# Patient Record
Sex: Male | Born: 1970 | Race: Black or African American | Hispanic: No | Marital: Married | Smoking: Never smoker
Health system: Southern US, Community
[De-identification: ages and names within clinical notes are randomized; demographics above are authoritative.]

## PROBLEM LIST (undated history)

## (undated) DIAGNOSIS — E669 Obesity, unspecified: Secondary | ICD-10-CM

## (undated) DIAGNOSIS — T7840XA Allergy, unspecified, initial encounter: Secondary | ICD-10-CM

## (undated) DIAGNOSIS — L731 Pseudofolliculitis barbae: Secondary | ICD-10-CM

## (undated) DIAGNOSIS — I10 Essential (primary) hypertension: Secondary | ICD-10-CM

## (undated) DIAGNOSIS — K429 Umbilical hernia without obstruction or gangrene: Secondary | ICD-10-CM

## (undated) DIAGNOSIS — F419 Anxiety disorder, unspecified: Secondary | ICD-10-CM

## (undated) DIAGNOSIS — K219 Gastro-esophageal reflux disease without esophagitis: Secondary | ICD-10-CM

## (undated) DIAGNOSIS — N401 Enlarged prostate with lower urinary tract symptoms: Secondary | ICD-10-CM

## (undated) DIAGNOSIS — B9681 Helicobacter pylori [H. pylori] as the cause of diseases classified elsewhere: Secondary | ICD-10-CM

## (undated) HISTORY — DX: Pseudofolliculitis barbae: L73.1

## (undated) HISTORY — DX: Essential (primary) hypertension: I10

## (undated) HISTORY — DX: Allergy, unspecified, initial encounter: T78.40XA

## (undated) HISTORY — PX: UMBILICAL HERNIA REPAIR: SHX196

## (undated) HISTORY — DX: Anxiety disorder, unspecified: F41.9

---

## 2005-12-29 ENCOUNTER — Other Ambulatory Visit: Payer: Self-pay

## 2005-12-29 ENCOUNTER — Emergency Department: Payer: Self-pay | Admitting: Emergency Medicine

## 2005-12-30 ENCOUNTER — Ambulatory Visit: Payer: Self-pay | Admitting: Emergency Medicine

## 2006-08-22 ENCOUNTER — Other Ambulatory Visit: Payer: Self-pay

## 2006-08-22 ENCOUNTER — Emergency Department: Payer: Self-pay | Admitting: General Practice

## 2006-11-11 ENCOUNTER — Emergency Department (HOSPITAL_COMMUNITY): Admission: EM | Admit: 2006-11-11 | Discharge: 2006-11-11 | Payer: Self-pay | Admitting: Emergency Medicine

## 2009-02-02 ENCOUNTER — Emergency Department: Payer: Self-pay | Admitting: Emergency Medicine

## 2009-10-29 ENCOUNTER — Emergency Department: Payer: Self-pay | Admitting: Internal Medicine

## 2010-09-02 IMAGING — CT CT HEAD WITHOUT CONTRAST
2 series · 16 of 30 positions shown, 20 images · non-contrast
Comparison: none

REASON FOR EXAM: syncoep
COMMENTS:

PROCEDURE:     CT  - CT HEAD WITHOUT CONTRAST  - October 29, 2009  [DATE]
RESULT:     Comparison:  None
TECHNIQUE: Multiple axial images from the foramen magnum to the vertex were
obtained without IV contrast.

[Series 2: without · axial · non-contrast · 0.40mm/px · z∈[-176,-50]mm · 13 of 31 slices shown, 17 images]
[im 3/31  brain]
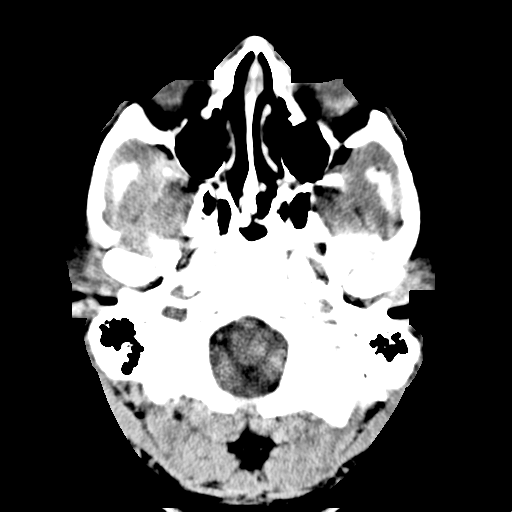
[im 3/31  bone]
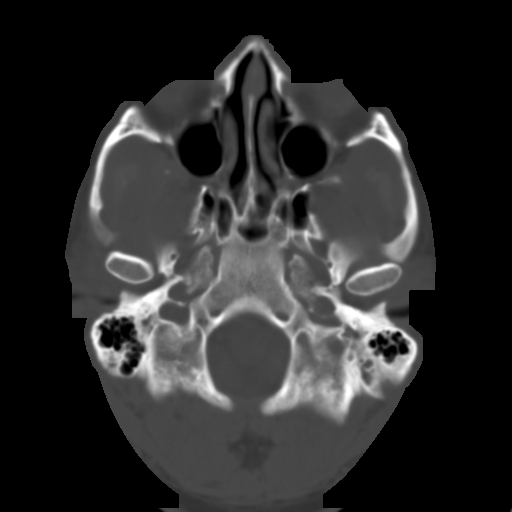
[im 5/31  brain]
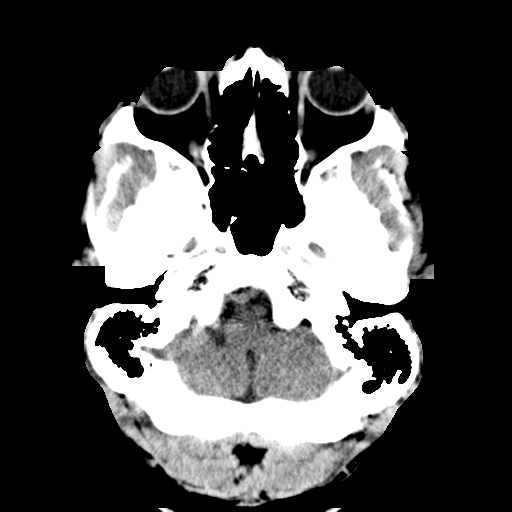
[im 7/31  brain]
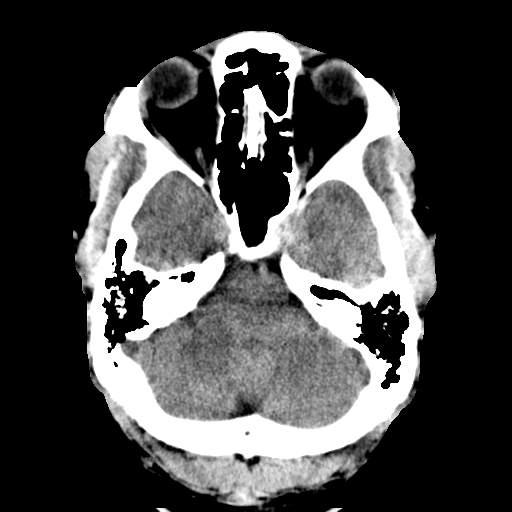
[im 9/31  brain]
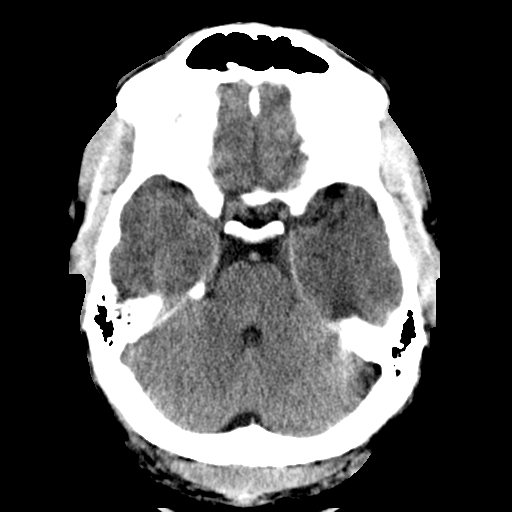
[im 11/31  brain]
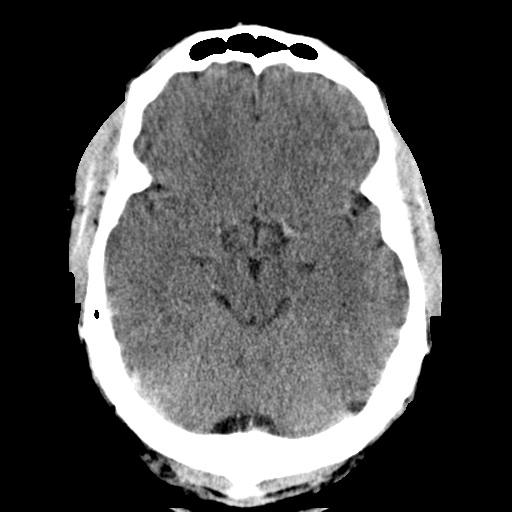
[im 11/31  bone]
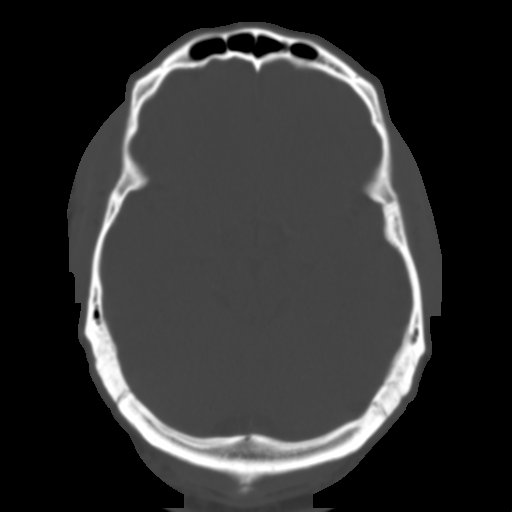
[im 13/31  brain]
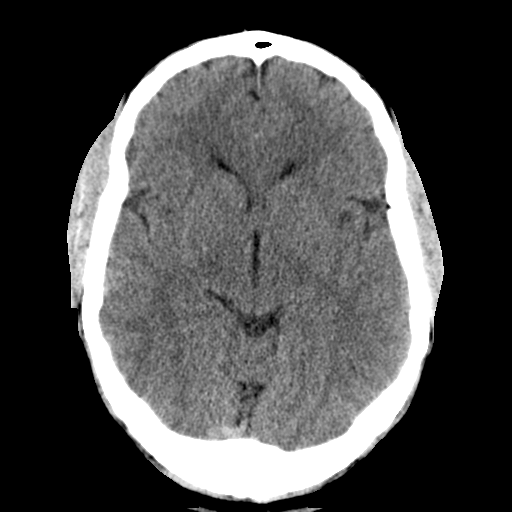
[im 16/31  brain]
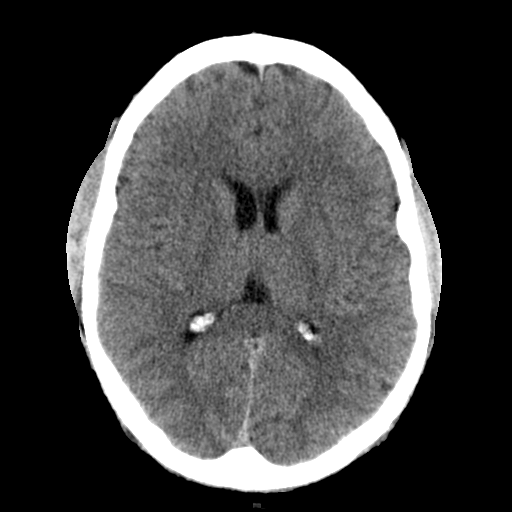
[im 18/31  brain]
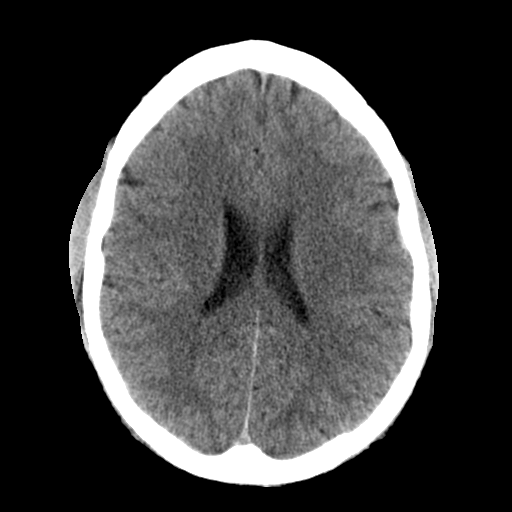
[im 20/31  brain]
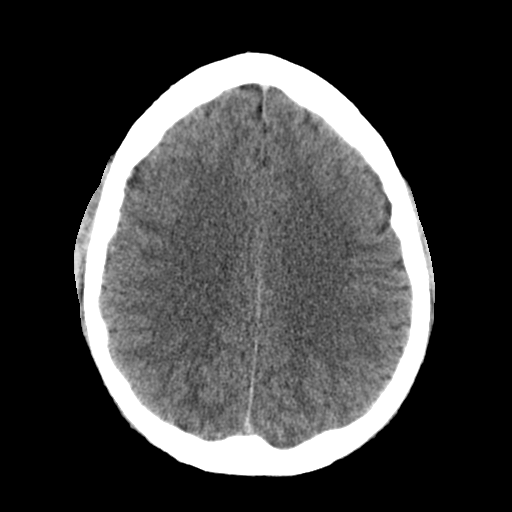
[im 20/31  bone]
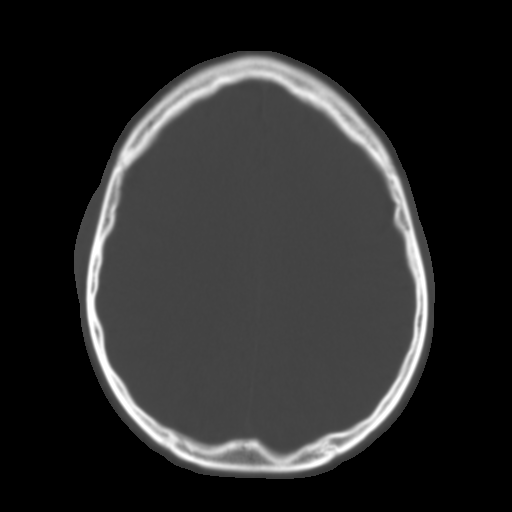
[im 22/31  brain]
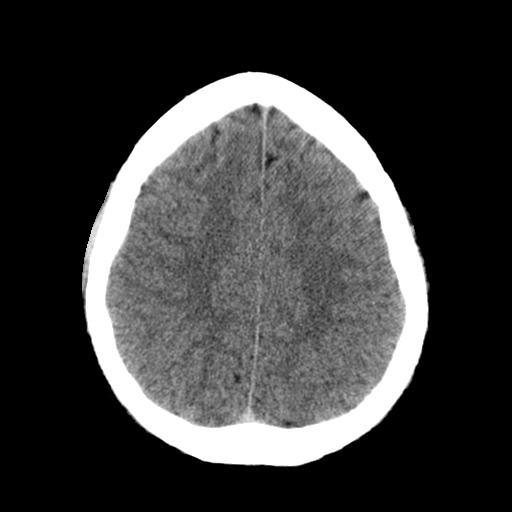
[im 24/31  brain]
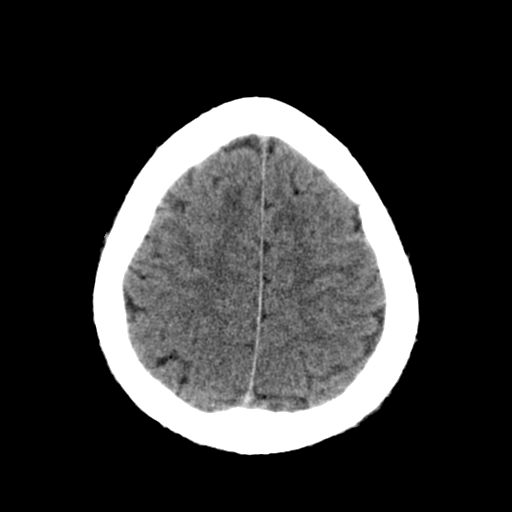
[im 26/31  brain]
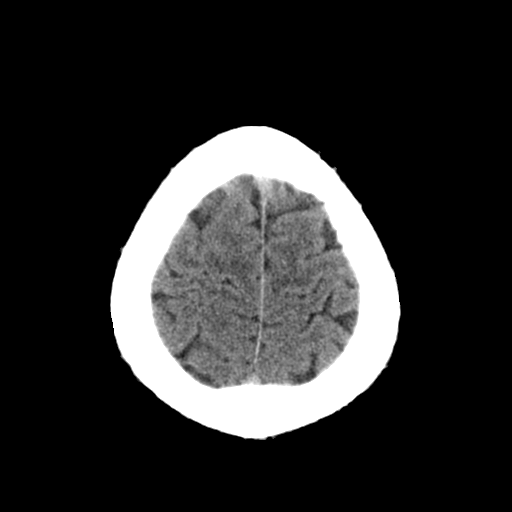
[im 28/31  brain]
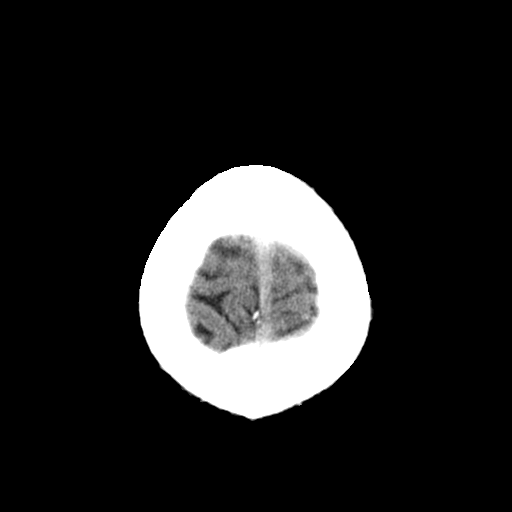
[im 28/31  bone]
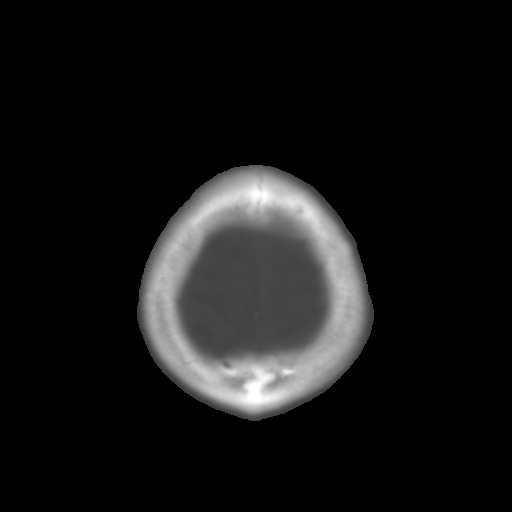

[Series 3: bone · axial · 0.40mm/px · z∈[-176,-136]mm · 3 of 31 slices shown]
[im 3/31  bone]
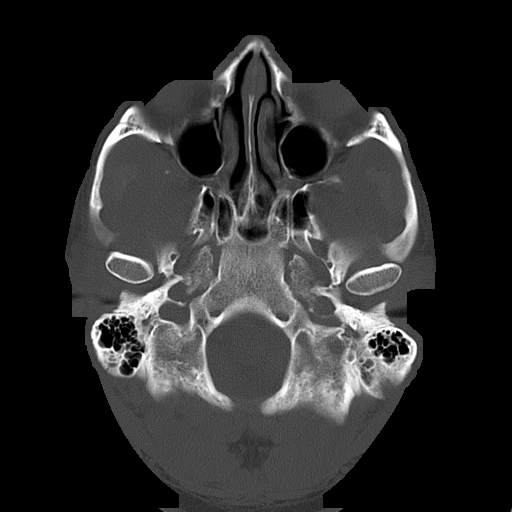
[im 7/31  bone]
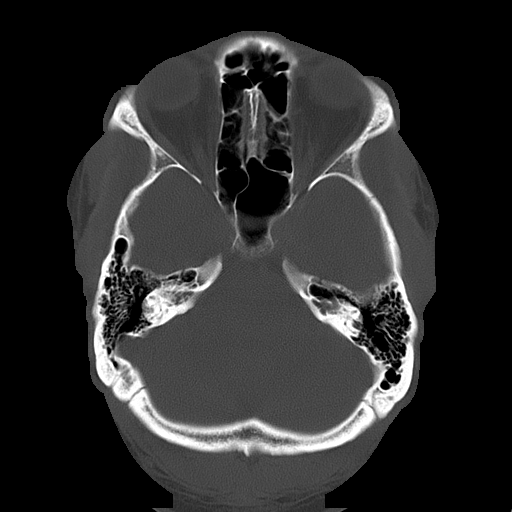
[im 11/31  bone]
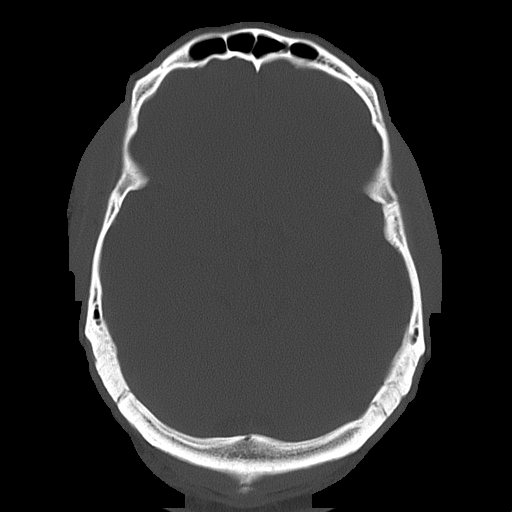

[16 of 30 positions shown; findings below may reference images not displayed]

FINDINGS: There is no evidence of mass effect, midline shift, or extra-axial fluid
collections.  There is no evidence of a space-occupying lesion or
intracranial hemorrhage. There is no evidence of a cortical-based area of
acute infarction.

The ventricles and sulci are appropriate for the patient's age. The basal
cisterns are patent.

Visualized portions of the orbits are unremarkable. The visualized portions
of the paranasal sinuses and mastoid air cells are unremarkable.

The osseous structures are unremarkable.
IMPRESSION: No acute intracranial process.

## 2010-09-02 IMAGING — US ABDOMEN ULTRASOUND
1 series · 17 of 25 positions shown · non-contrast
Comparison: none

REASON FOR EXAM: abd pain
COMMENTS:

[Series 1: abdomen ultrasound · 17 of 66 slices shown]
[im 1/66]
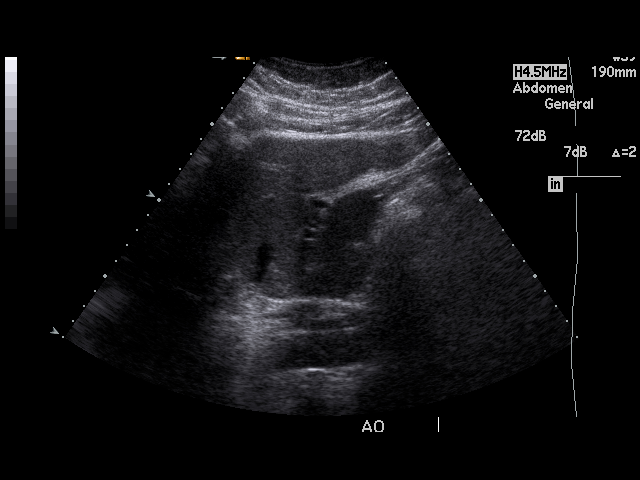
[im 6/66]
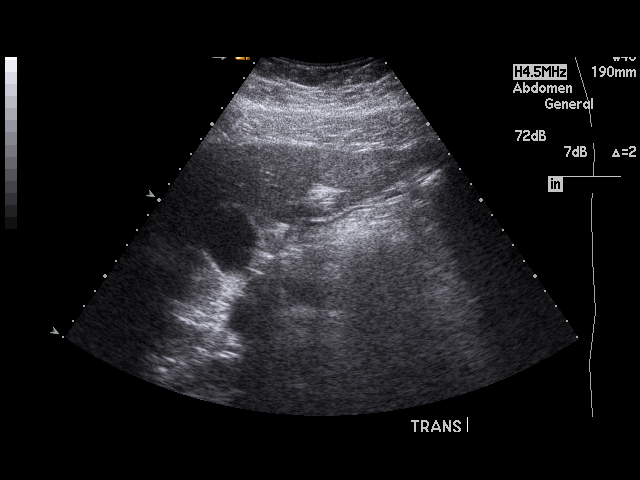
[im 9/66]
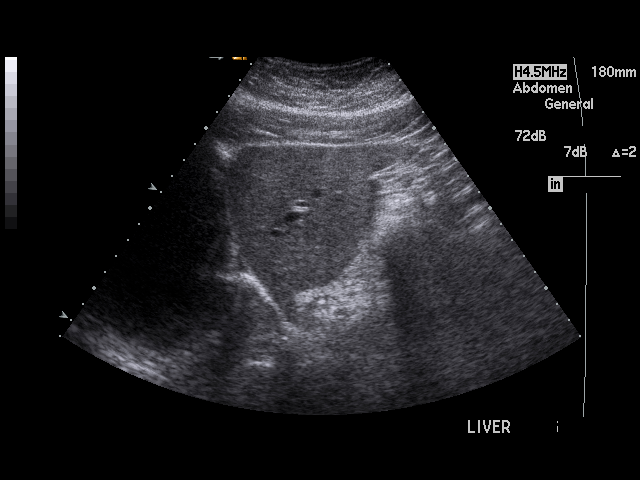
[im 14/66]
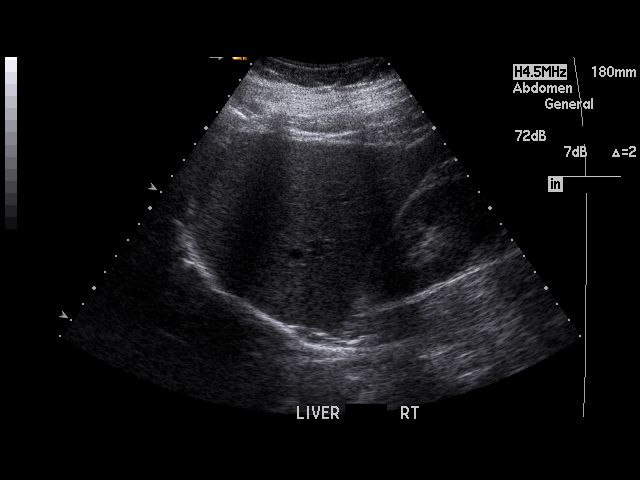
[im 17/66]
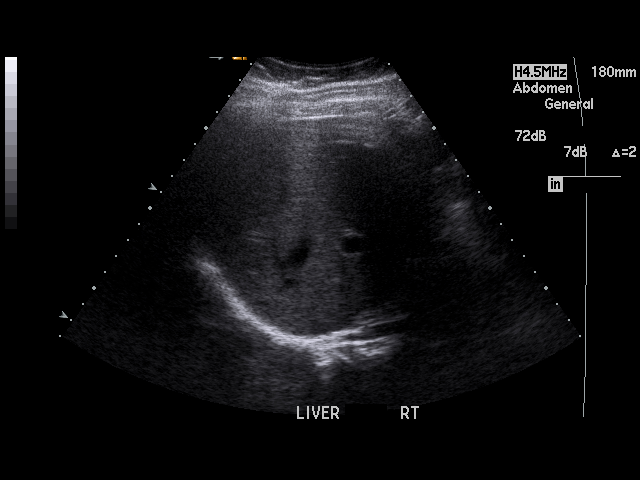
[im 22/66]
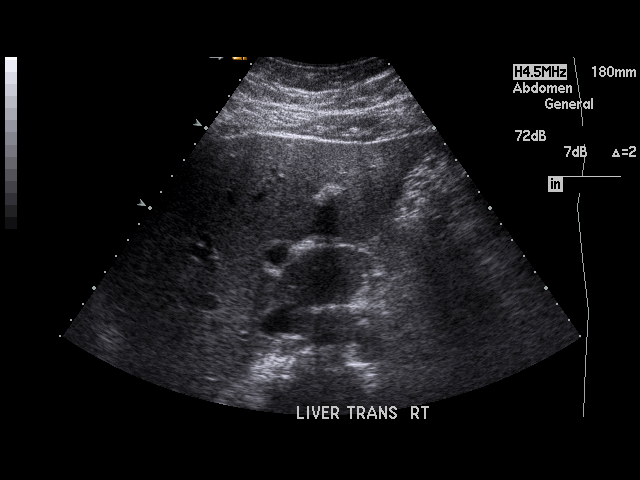
[im 25/66]
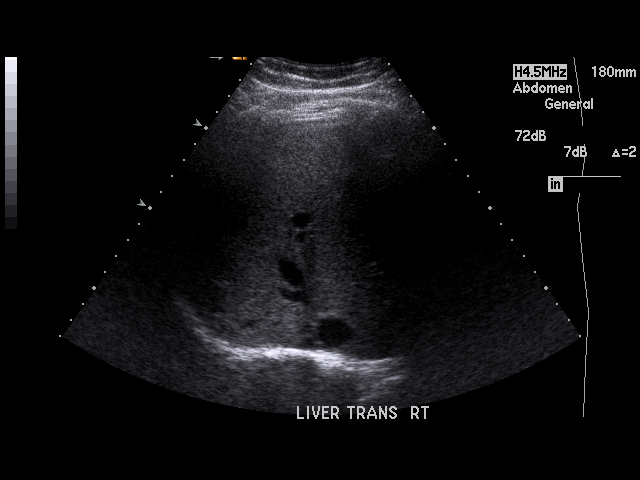
[im 30/66]
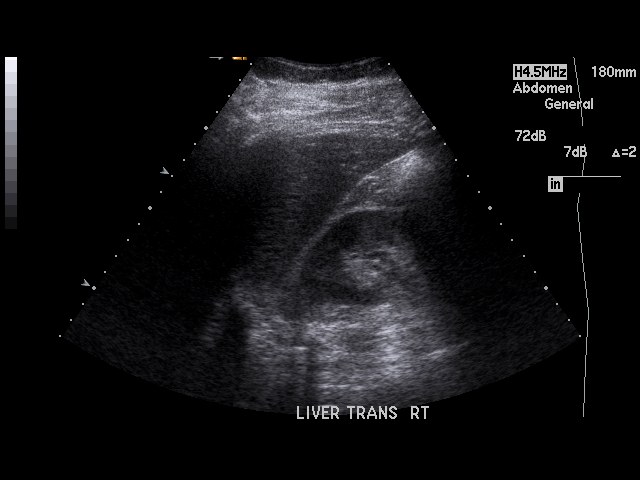
[im 33/66]
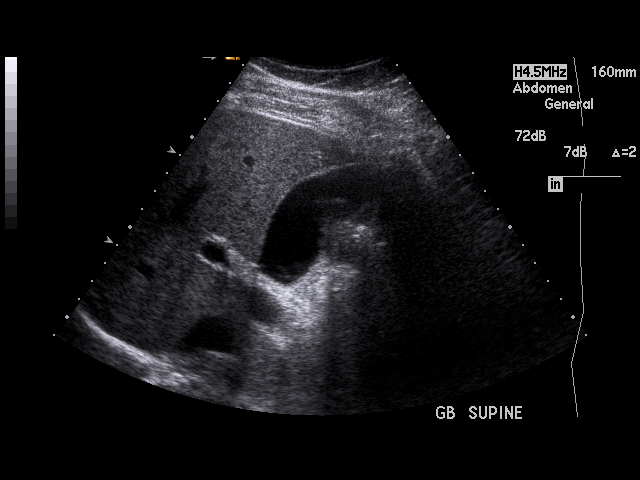
[im 36/66]
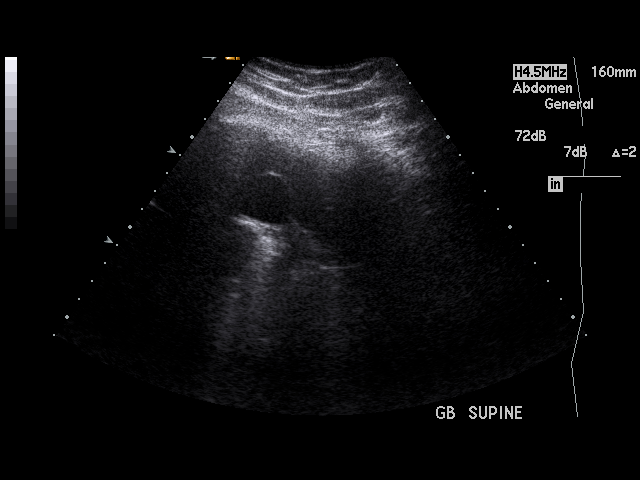
[im 41/66]
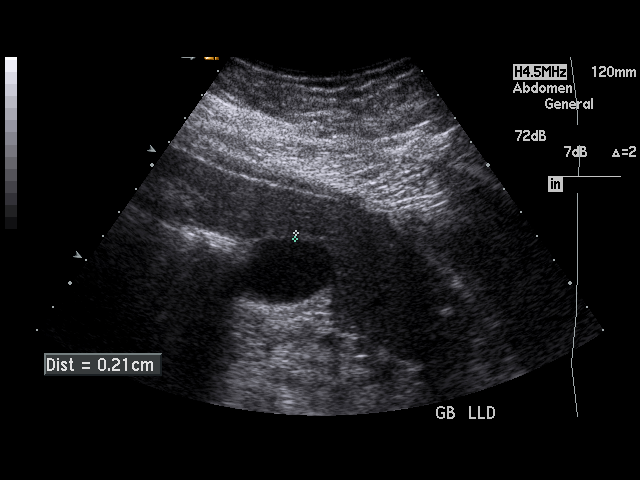
[im 44/66]
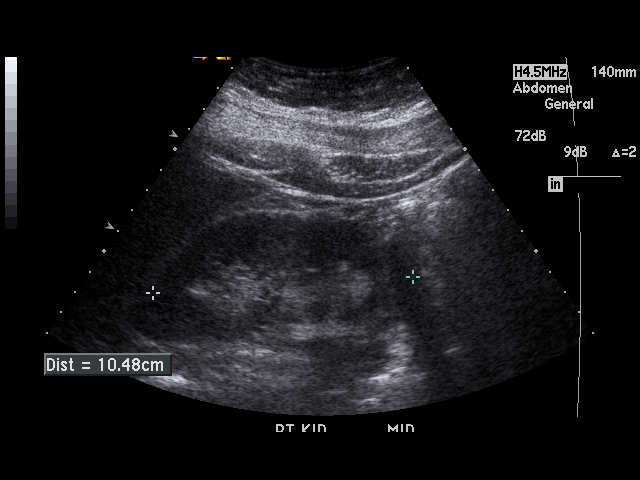
[im 49/66]
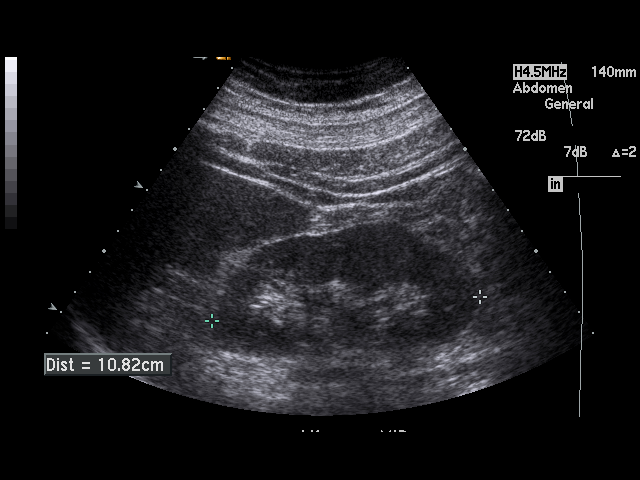
[im 52/66]
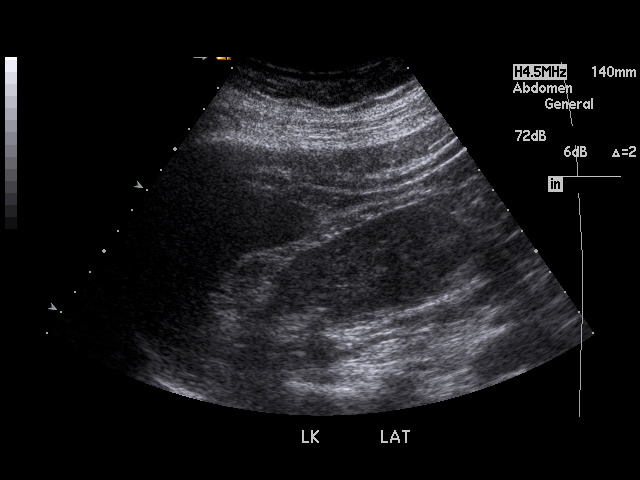
[im 57/66]
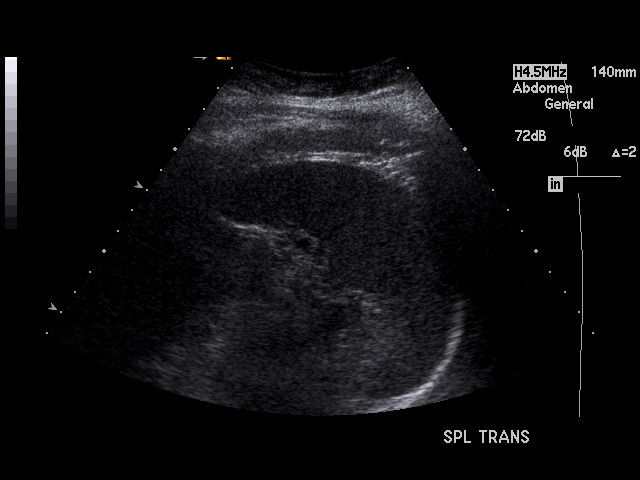
[im 60/66]
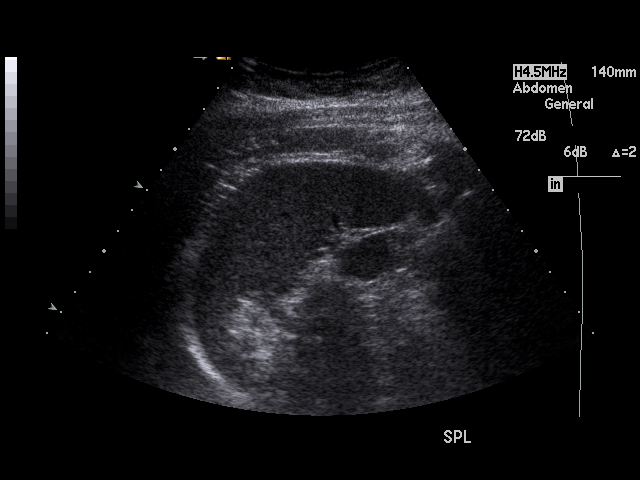
[im 66/66]
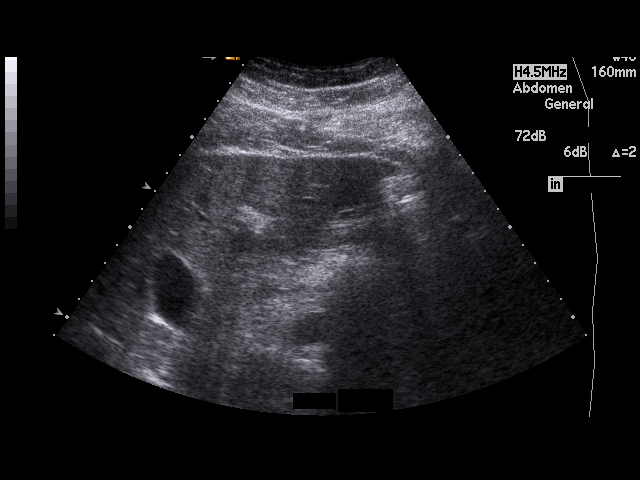

[17 of 25 positions shown; findings below may reference images not displayed]

PROCEDURE:     US  - US ABDOMEN GENERAL SURVEY  - October 29, 2009  [DATE]

RESULT:     The liver, spleen, abdominal aorta and inferior vena cava show
no significant abnormalities. The head and body of the pancreas are normal
in appearance. The pancreatic tail is obscured by bowel gas. No gallstones
are seen. There is no thickening of the gallbladder wall. The common bile
duct measures 2.0 mm in diameter which is within normal limits. The kidneys
show no hydronephrosis. There is no ascites.
IMPRESSION: No significant abnormalities are noted.

## 2011-01-08 ENCOUNTER — Emergency Department: Payer: Self-pay | Admitting: Internal Medicine

## 2012-07-16 ENCOUNTER — Emergency Department: Payer: Self-pay | Admitting: Unknown Physician Specialty

## 2012-07-17 LAB — CBC
HGB: 14.3 g/dL (ref 13.0–18.0)
MCV: 87 fL (ref 80–100)
Platelet: 182 10*3/uL (ref 150–440)
RBC: 4.8 10*6/uL (ref 4.40–5.90)
RDW: 13.2 % (ref 11.5–14.5)
WBC: 5.1 10*3/uL (ref 3.8–10.6)

## 2012-07-17 LAB — COMPREHENSIVE METABOLIC PANEL
Anion Gap: 7 (ref 7–16)
Calcium, Total: 8.6 mg/dL (ref 8.5–10.1)
Co2: 27 mmol/L (ref 21–32)
Osmolality: 282 (ref 275–301)
SGOT(AST): 33 U/L (ref 15–37)
SGPT (ALT): 30 U/L (ref 12–78)
Sodium: 142 mmol/L (ref 136–145)

## 2012-07-17 LAB — TROPONIN I: Troponin-I: <0.02 ng/mL

## 2012-07-17 LAB — TSH: Thyroid Stimulating Horm: 3.07 u[IU]/mL

## 2016-10-03 ENCOUNTER — Encounter: Payer: Self-pay | Admitting: Nurse Practitioner

## 2016-10-03 ENCOUNTER — Ambulatory Visit (INDEPENDENT_AMBULATORY_CARE_PROVIDER_SITE_OTHER): Payer: BLUE CROSS/BLUE SHIELD | Admitting: Nurse Practitioner

## 2016-10-03 VITALS — BP 141/95 | HR 73 | Temp 97.8°F | Resp 16 | Ht 73.0 in | Wt 264.0 lb

## 2016-10-03 DIAGNOSIS — Z7689 Persons encountering health services in other specified circumstances: Secondary | ICD-10-CM

## 2016-10-03 DIAGNOSIS — J3089 Other allergic rhinitis: Secondary | ICD-10-CM | POA: Diagnosis not present

## 2016-10-03 DIAGNOSIS — Z6834 Body mass index (BMI) 34.0-34.9, adult: Secondary | ICD-10-CM | POA: Diagnosis not present

## 2016-10-03 DIAGNOSIS — R030 Elevated blood-pressure reading, without diagnosis of hypertension: Secondary | ICD-10-CM | POA: Diagnosis not present

## 2016-10-03 NOTE — Progress Notes (Signed)
Subjective:    Patient ID: Dean Morton, male    DOB: 09/16/1970, 46 y.o.   MRN: [id number]  Dean Morton is a 46 y.o. male presenting on 10/03/2016 for Establish Care  HPI  Mr. Mcclenathan is establishing a new PCP because his former PCP died last year.    Elevated BP and BMI  He has concerns about 2-3 isolated high blood pressure readings outside of the clinic setting.  Also concerned about BMI.  Hard to motivate to stay away from the foods he knows aren't healthy.  For the last 2 months he walks and exercises daily and notices his BP coming down and his clothes fitting looser.  He is eating better and is cutting back on pork, sugar, and some salt.  The highest BP readings were around 160/100 two months ago.  His last outside clinic BP was 133/88. Today is 141/95.  His weight 2 months ago was 289 lbs.  Today he weighs 274 lbs for a weight loss to date of 15 lbs.  Allergies  He reports a yearly URI - This year he was sick 3 weeks ago with URI.  He took amoxicillin without relief.  Then, he took levofloxacin and had diarrhea.  He ate some yogurt and bowels normal now with formed stool.  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  Workplace health and safety added a respirator and improved ventilation.  Since these improvements his eye allergies are better, but questions if he needs a different respirator.  Right knee pain Old sports injury in high school. Since the injury there is a bony protrusion to the posterior medial aspect of the knee.  Last week it flared with pain and swelling and he is a little concerned that it might flare in future.    Past Medical History:  Diagnosis Date  . Allergy    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,000 +)  . Anxiety   . Hypertension    took BP meds with panic attacks  . Pseudofolliculitis barbae    Past Surgical History:  Procedure Laterality Date  . UMBILICAL HERNIA REPAIR      Social History   Social History  . Marital status: Married    Spouse name: N/A  . Number of children: N/A  . Years of education: N/A   Occupational History  . Not on file.   Social History Main Topics  . Smoking status: Never Smoker  . Smokeless tobacco: Never Used  . Alcohol use No  . Drug use: No  . Sexual activity: Yes    Birth control/ protection: None   Other Topics Concern  . Not on file   Social History Narrative  . No narrative on file   Family History  Problem Relation Age of Onset  . Hypertension Mother   . Stroke Maternal Grandmother    No current outpatient prescriptions on file prior to visit.   No current facility-administered medications on file prior to visit.    Since losing his PCP, he has been to some free employer screenings for BP, cholesterol, but notes he missed the prostate screen.  He had a colonoscopy at [facility] in 2007 to eval anxiety/GERD.  "Clipped noncancerous node in stomach" but there were no polyps or precancerous lesions then.  [facility].  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  Contaminants include white phosphorous.  Washing and bathing in contaminated water - Got home from training and had a letter from [place] and can't find it that requested testing after he ended active duty.   Review of Systems Per HPI unless specifically indicated above     Objective:    BP (!) 141/95 (BP Location: Right Arm, Patient Position: Sitting, Cuff Size: Large)   Pulse 73   Temp 97.8 F (36.6 C) (Oral)   Resp 16   Ht  (1.854 m)   Wt 264 lb (119.7 kg)   BMI 34.83 kg/m   Wt Readings from Last 3 Encounters:  10/03/16 264 lb (119.7 kg)    Physical Exam  Constitutional: He is oriented to person, place, and time. He appears well-developed and well-nourished. No distress.  HENT:  Head: Normocephalic and atraumatic.  Right Ear:  External ear normal.  Left Ear: External ear normal.  Nose: Nose normal.  Mouth/Throat: Oropharynx is clear and moist.  Eyes: EOM are normal. Pupils are equal, round, and reactive to light. Right conjunctiva is injected. Left conjunctiva is injected.  Lacrimation  Neck: Normal range of motion. Neck supple. No JVD present. Carotid bruit is not present. No tracheal deviation present. No thyromegaly present.  Cardiovascular: Normal rate, regular rhythm, normal heart sounds and intact distal pulses.   Pulmonary/Chest: Effort normal and breath sounds normal.  Abdominal: Soft. Bowel sounds are normal. He exhibits no distension and no mass. There is no tenderness.  Musculoskeletal: Normal range of motion.  Lymphadenopathy:    He has no cervical adenopathy.  Neurological: He is alert and oriented to person, place, and time. He has normal reflexes.  Skin: Skin is warm and dry.  Raised hair follicles/scaring in beard pattern on face  Psychiatric: He has a normal mood and affect. His behavior is normal. Judgment and thought content normal.  Vitals reviewed.     Assessment & Plan:   Problem List Items Addressed This Visit    Right knee pain, intermittent 1. Discussed to avoid ibuprofen r/t blood pressure.  Use acetaminophen if needing pain relief. 2. Use ice or heat application for inflammation 3. OTC muscle rub with lidocaine is a topical analgesic ok to use.  Avoid use with heat and ice. It is ok to use after ice and heat. 4. Consider orthopedic referral if persistent problems.  Environmental and seasonal allergies 1. Continue working with workplace health and safety for environmental exposures.    BMI 34.0-34.9,adult 1. Continue exercise plan and focus on diet. 2. Ambulatory nutrition counseling discussed and patient requested to help with his efforts toward weight loss and BP lowering.   Relevant Orders   Ambulatory referral to diabetic education   Elevated blood pressure reading 1  Continue exercise regimen. 2. Continue working on diet changes.  Discussed DASH diet. 3. Referral for nutrition counseling for additional advice. 4. Keep a BP log with BP reading every 1-2 weeks for next 3 months.  Goal is less than 130/90.  DIscussed emergency situations for seeking care in ED or via ambulance. 5. Reviewed signs/sx of hypertensive crisis and stroke.    Relevant Orders   Ambulatory referral to diabetic education    Other Visit Diagnoses    Establishing care with new doctor, encounter for    -  Primary      Meds ordered this encounter  Medications  . ePHEDrine-GuaiFENesin (BRONKAID PO)    Sig: Take 1 tablet by mouth daily as needed.   Marland Kitchen dextromethorphan-guaiFENesin (MUCINEX DM) 30-600 MG 12hr tablet  Sig: Take 1 tablet by mouth 2 (two) times daily as needed for cough.  . DISCONTD: LEVOFLOXACIN PO    Sig: Take 700 mg by mouth daily.     Follow up plan: Return in about 3 months (around 01/03/2017) for blood pressure management.  Wilhelmina Mcardle, DNP, AGNP-BC Adult Gerontology Nurse Practitioner [facility] 10/04/2016, 12:27 PM

## 2016-10-03 NOTE — Patient Instructions (Signed)
Dean Morton, Thank you for coming in to clinic today.   1. For your blood pressure: - Keep exercising. - Keep working on your diet.  - Nutrition department will call you to schedule.   -  Keep a blood pressure log.  Check about every 1-2 weeks for the next 3 months.  For now, your goal is less than 140/90.  Our long-term goal is less than 130/90.  2. For any knee pain you have - Avoid ibuprofen, aspirin and all other NSAIDs because they can raise your BP. - Apply ice and heat if it has flared.  Put ice or heat on the knee for 15 mins at a time, then take it off.  Up to 5-6 times per day. - You can also apply a muscle rub with lidocaine for pain if needed.  Do not use this before ice or heat because it can burn your skin. You can use it after.   - If you continue to have problems we can consider an orthopedic referral.   Please schedule a follow-up appointment with Wilhelmina Mcardle, AGNP in 3 months.  If you have any other questions or concerns, please feel free to call the clinic or send a message through MyChart. You may also schedule an earlier appointment if necessary.  Wilhelmina Mcardle, DNP, AGNP-BC Adult Gerontology Nurse Practitioner [facility]    DASH Eating Plan DASH stands for "Dietary Approaches to Stop Hypertension." The DASH eating plan is a healthy eating plan that has been shown to reduce high blood pressure (hypertension). It may also reduce your risk for type 2 diabetes, heart disease, and stroke. The DASH eating plan may also help with weight loss. What are tips for following this plan? General guidelines   Avoid eating more than 2,300 mg (milligrams) of salt (sodium) a day.   Limit alcohol intake to no more than 1 drink a day for nonpregnant women and 2 drinks a day for men. One drink equals 12 oz of beer, 5 oz of wine, or 1 oz of hard liquor.  Work with your health care provider to maintain a healthy body weight or to lose weight. Ask what an  ideal weight is for you.  Get at least 30 minutes of exercise that causes your heart to beat faster (aerobic exercise) most days of the week. Activities may include walking, swimming, or biking.  Work with your health care provider or diet and nutrition specialist (dietitian) to adjust your eating plan to your individual calorie needs. Reading food labels   Check food labels for the amount of sodium per serving. Choose foods with less than 5 percent of the Daily Value of sodium. Generally, foods with less than 300 mg of sodium per serving fit into this eating plan.  To find whole grains, look for the word "whole" as the first word in the ingredient list. Shopping   Buy products labeled as "low-sodium" or "no salt added."  Buy fresh foods. Avoid canned foods and premade or frozen meals. Cooking   Avoid adding salt when cooking. Use salt-free seasonings or herbs instead of table salt or sea salt. Check with your health care provider or pharmacist before using salt substitutes.  Do not fry foods. Cook foods using healthy methods such as baking, boiling, grilling, and broiling instead.  Cook with heart-healthy oils, such as olive, canola, soybean, or sunflower oil. Meal planning    Eat a balanced diet that includes:  5 or more servings of  fruits and vegetables each day. At each meal, try to fill half of your plate with fruits and vegetables.  Up to 6-8 servings of whole grains each day.  Less than 6 oz of lean meat, poultry, or fish each day. A 3-oz serving of meat is about the same size as a deck of cards. One egg equals 1 oz.  2 servings of low-fat dairy each day.  A serving of nuts, seeds, or beans 5 times each week.  Heart-healthy fats. Healthy fats called Omega-3 fatty acids are found in foods such as flaxseeds and coldwater fish, like sardines, salmon, and mackerel.  Limit how much you eat of the following:  Canned or prepackaged foods.  Food that is high in trans fat,  such as fried foods.  Food that is high in saturated fat, such as fatty meat.  Sweets, desserts, sugary drinks, and other foods with added sugar.  Full-fat dairy products.  Do not salt foods before eating.  Try to eat at least 2 vegetarian meals each week.  Eat more home-cooked food and less restaurant, buffet, and fast food.  When eating at a restaurant, ask that your food be prepared with less salt or no salt, if possible. What foods are recommended? The items listed may not be a complete list. Talk with your dietitian about what dietary choices are best for you. Grains  Whole-grain or whole-wheat bread. Whole-grain or whole-wheat pasta. Brown rice. Orpah Cobb. Bulgur. Whole-grain and low-sodium cereals. Pita bread. Low-fat, low-sodium crackers. Whole-wheat flour tortillas. Vegetables  Fresh or frozen vegetables (raw, steamed, roasted, or grilled). Low-sodium or reduced-sodium tomato and vegetable juice. Low-sodium or reduced-sodium tomato sauce and tomato paste. Low-sodium or reduced-sodium canned vegetables. Fruits  All fresh, dried, or frozen fruit. Canned fruit in natural juice (without added sugar). Meat and other protein foods  Skinless chicken or [place]. Ground chicken or [place]. Pork with fat trimmed off. Fish and seafood. Egg whites. Dried beans, peas, or lentils. Unsalted nuts, nut butters, and seeds. Unsalted canned beans. Lean cuts of beef with fat trimmed off. Low-sodium, lean deli meat. Dairy  Low-fat (1%) or fat-free (skim) milk. Fat-free, low-fat, or reduced-fat cheeses. Nonfat, low-sodium ricotta or cottage cheese. Low-fat or nonfat yogurt. Low-fat, low-sodium cheese. Fats and oils  Soft margarine without trans fats. Vegetable oil. Low-fat, reduced-fat, or light mayonnaise and salad dressings (reduced-sodium). Canola, safflower, olive, soybean, and sunflower oils. Avocado. Seasoning and other foods  Herbs. Spices. Seasoning mixes without salt. Unsalted popcorn  and pretzels. Fat-free sweets. What foods are not recommended? The items listed may not be a complete list. Talk with your dietitian about what dietary choices are best for you. Grains  Baked goods made with fat, such as croissants, muffins, or some breads. Dry pasta or rice meal packs. Vegetables  Creamed or fried vegetables. Vegetables in a cheese sauce. Regular canned vegetables (not low-sodium or reduced-sodium). Regular canned tomato sauce and paste (not low-sodium or reduced-sodium). Regular tomato and vegetable juice (not low-sodium or reduced-sodium). Rosita Fire. Olives. Fruits  Canned fruit in a light or heavy syrup. Fried fruit. Fruit in cream or butter sauce. Meat and other protein foods  Fatty cuts of meat. Ribs. Fried meat. Tomasa Blase. Sausage. Bologna and other processed lunch meats. Salami. Fatback. Hotdogs. Bratwurst. Salted nuts and seeds. Canned beans with added salt. Canned or smoked fish. Whole eggs or egg yolks. Chicken or [place] with skin. Dairy  Whole or 2% milk, cream, and half-and-half. Whole or full-fat cream cheese. Whole-fat or sweetened yogurt.  Full-fat cheese. Nondairy creamers. Whipped toppings. Processed cheese and cheese spreads. Fats and oils  Butter. Stick margarine. Lard. Shortening. Ghee. Bacon fat. Tropical oils, such as coconut, palm kernel, or palm oil. Seasoning and other foods  Salted popcorn and pretzels. Onion salt, garlic salt, seasoned salt, table salt, and sea salt. Worcestershire sauce. Tartar sauce. Barbecue sauce. Teriyaki sauce. Soy sauce, including reduced-sodium. Steak sauce. Canned and packaged gravies. Fish sauce. Oyster sauce. Cocktail sauce. Horseradish that you find on the shelf. Ketchup. Mustard. Meat flavorings and tenderizers. Bouillon cubes. Hot sauce and Tabasco sauce. Premade or packaged marinades. Premade or packaged taco seasonings. Relishes. Regular salad dressings. Where to find more information:  National Heart, Lung, and Blood  Institute: PopSteam.is  American Heart Association: www.heart.org Summary  The DASH eating plan is a healthy eating plan that has been shown to reduce high blood pressure (hypertension). It may also reduce your risk for type 2 diabetes, heart disease, and stroke.  With the DASH eating plan, you should limit salt (sodium) intake to 2,300 mg a day. If you have hypertension, you may need to reduce your sodium intake to 1,500 mg a day.  When on the DASH eating plan, aim to eat more fresh fruits and vegetables, whole grains, lean proteins, low-fat dairy, and heart-healthy fats.  Work with your health care provider or diet and nutrition specialist (dietitian) to adjust your eating plan to your individual calorie needs. This information is not intended to replace advice given to you by your health care provider. Make sure you discuss any questions you have with your health care provider. Document Released: 06/12/2011 Document Revised: 06/16/2016 Document Reviewed: 06/16/2016 Elsevier Interactive Patient Education  2017 [employer].

## 2016-10-04 ENCOUNTER — Encounter: Payer: Self-pay | Admitting: Nurse Practitioner

## 2016-10-04 DIAGNOSIS — Z6834 Body mass index (BMI) 34.0-34.9, adult: Secondary | ICD-10-CM | POA: Insufficient documentation

## 2016-10-04 DIAGNOSIS — R030 Elevated blood-pressure reading, without diagnosis of hypertension: Secondary | ICD-10-CM | POA: Insufficient documentation

## 2016-10-04 DIAGNOSIS — J3089 Other allergic rhinitis: Secondary | ICD-10-CM | POA: Insufficient documentation

## 2016-10-04 DIAGNOSIS — E669 Obesity, unspecified: Secondary | ICD-10-CM | POA: Insufficient documentation

## 2016-10-06 NOTE — Progress Notes (Signed)
I have reviewed this encounter including the documentation in this note and/or discussed this patient with the provider, Wilhelmina Mcardle, AGPCNP-BC. I am certifying that I agree with the content of this note as supervising physician.  Saralyn Pilar, DO [facility] 10/06/2016, 12:20 PM

## 2017-01-16 ENCOUNTER — Ambulatory Visit: Payer: BLUE CROSS/BLUE SHIELD | Admitting: Nurse Practitioner

## 2017-05-28 ENCOUNTER — Emergency Department: Payer: BLUE CROSS/BLUE SHIELD

## 2017-05-28 ENCOUNTER — Other Ambulatory Visit: Payer: Self-pay

## 2017-05-28 ENCOUNTER — Emergency Department
Admission: EM | Admit: 2017-05-28 | Discharge: 2017-05-28 | Disposition: A | Discharge status: Home / Self Care | Payer: BLUE CROSS/BLUE SHIELD | Attending: Emergency Medicine | Admitting: Emergency Medicine

## 2017-05-28 ENCOUNTER — Encounter: Payer: Self-pay | Admitting: Emergency Medicine

## 2017-05-28 DIAGNOSIS — K2100 Gastro-esophageal reflux disease with esophagitis, without bleeding: Secondary | ICD-10-CM

## 2017-05-28 DIAGNOSIS — K229 Disease of esophagus, unspecified: Secondary | ICD-10-CM | POA: Insufficient documentation

## 2017-05-28 DIAGNOSIS — I10 Essential (primary) hypertension: Secondary | ICD-10-CM | POA: Insufficient documentation

## 2017-05-28 DIAGNOSIS — R1013 Epigastric pain: Secondary | ICD-10-CM | POA: Diagnosis present

## 2017-05-28 LAB — BASIC METABOLIC PANEL
Anion gap: 8 (ref 5–15)
BUN: 16 mg/dL (ref 6–20)
CHLORIDE: 103 mmol/L (ref 101–111)
CO2: 28 mmol/L (ref 22–32)
Calcium: 9.6 mg/dL (ref 8.9–10.3)
Creatinine, Ser: 1.22 mg/dL (ref 0.61–1.24)
GFR calc Af Amer: >60 mL/min (ref >60)
Glucose, Bld: 84 mg/dL (ref 65–99)
POTASSIUM: 4 mmol/L (ref 3.5–5.1)
SODIUM: 139 mmol/L (ref 135–145)

## 2017-05-28 LAB — CBC
HEMATOCRIT: 44.7 % (ref 40.0–52.0)
Hemoglobin: 15.3 g/dL (ref 13.0–18.0)
MCH: 30 pg (ref 26.0–34.0)
MCHC: 34.2 g/dL (ref 32.0–36.0)
MCV: 87.7 fL (ref 80.0–100.0)
PLATELETS: 208 10*3/uL (ref 150–440)
RBC: 5.1 MIL/uL (ref 4.40–5.90)
RDW: 13.5 % (ref 11.5–14.5)
WBC: 4.3 10*3/uL (ref 3.8–10.6)

## 2017-05-28 LAB — TROPONIN I: Troponin I: <0.03 ng/mL (ref <0.03)

## 2017-05-28 NOTE — Discharge Instructions (Signed)
Please continue taking your Nexium every day and make an appointment to follow-up with the gastroenterologist in the next few weeks.  Make sure you cut up all of your food into small bites to help prevent it from getting stuck.  Return to the emergency department for any concerns whatsoever.  It was a pleasure to take care of you today, and thank you for coming to our emergency department.  If you have any questions or concerns before leaving please ask the nurse to grab me and I'm more than happy to go through your aftercare instructions again.  If you were prescribed any opioid pain medication today such as Norco, Vicodin, Percocet, morphine, hydrocodone, or oxycodone please make sure you do not drive when you are taking this medication as it can alter your ability to drive safely.  If you have any concerns once you are home that you are not improving or are in fact getting worse before you can make it to your follow-up appointment, please do not hesitate to call 911 and come back for further evaluation.  Merrily BrittleNeil Josaiah Muhammed, MD  Results for orders placed or performed during the hospital encounter of 05/28/17  Basic metabolic panel  Result Value Ref Range   Sodium 139 135 - 145 mmol/L   Potassium 4.0 3.5 - 5.1 mmol/L   Chloride 103 101 - 111 mmol/L   CO2 28 22 - 32 mmol/L   Glucose, Bld 84 65 - 99 mg/dL   BUN 16 6 - 20 mg/dL   Creatinine, Ser 8.411.22 0.61 - 1.24 mg/dL   Calcium 9.6 8.9 - 32.410.3 mg/dL   GFR calc non Af Amer >60 >60 mL/min   GFR calc Af Amer >60 >60 mL/min   Anion gap 8 5 - 15  CBC  Result Value Ref Range   WBC 4.3 3.8 - 10.6 K/uL   RBC 5.10 4.40 - 5.90 MIL/uL   Hemoglobin 15.3 13.0 - 18.0 g/dL   HCT 40.144.7 02.740.0 - 25.352.0 %   MCV 87.7 80.0 - 100.0 fL   MCH 30.0 26.0 - 34.0 pg   MCHC 34.2 32.0 - 36.0 g/dL   RDW 66.413.5 40.311.5 - 47.414.5 %   Platelets 208 150 - 440 K/uL  Troponin I  Result Value Ref Range   Troponin I <0.03 <0.03 ng/mL   Dg Chest 2 View  Result Date:  05/28/2017 CLINICAL DATA:  Chest pain when eating. EXAM: CHEST  2 VIEW COMPARISON:  None. FINDINGS: The cardiomediastinal silhouette is normal in size. Normal pulmonary vascularity. No focal consolidation, pleural effusion, or pneumothorax. Mild age-indeterminate wedging of a lower thoracic vertebral body, likely T11. IMPRESSION: 1.  No active cardiopulmonary disease. 2. Mild age-indeterminate wedging of a lower thoracic vertebral body, likely T11. This is likely chronic in the absence of recent trauma. Correlate with point tenderness. Electronically Signed   By: Obie DredgeWilliam T Derry M.D.   On: 05/28/2017 14:40
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
## 2017-05-28 NOTE — ED Notes (Signed)
Pt state "it aint no chest pain, it's just when I swallow I feel discomfort in my chest." pt states after eating he burps. Started nexium last night. States increase in stress x few days. Pt points mid chest and describes it as discomfort. State drinking doesn't bother him, it's just when he eats. States he noticed it after eating [employer]. Pt alert, oriented, ambulatory. Speaking in clear sentences. No distress noted at this time. States he has indigestion sometimes but points to throat when talking about indigestion.

## 2017-06-19 ENCOUNTER — Ambulatory Visit: Payer: BLUE CROSS/BLUE SHIELD | Admitting: Gastroenterology

## 2017-06-26 ENCOUNTER — Ambulatory Visit: Payer: BLUE CROSS/BLUE SHIELD | Admitting: Gastroenterology

## 2017-09-10 ENCOUNTER — Emergency Department: Payer: BLUE CROSS/BLUE SHIELD

## 2017-09-10 ENCOUNTER — Other Ambulatory Visit: Payer: Self-pay

## 2017-09-10 ENCOUNTER — Encounter: Payer: Self-pay | Admitting: Emergency Medicine

## 2017-09-10 ENCOUNTER — Emergency Department
Admission: EM | Admit: 2017-09-10 | Discharge: 2017-09-10 | Disposition: A | Discharge status: Home / Self Care | Payer: BLUE CROSS/BLUE SHIELD | Attending: Emergency Medicine | Admitting: Emergency Medicine

## 2017-09-10 DIAGNOSIS — R064 Hyperventilation: Secondary | ICD-10-CM | POA: Insufficient documentation

## 2017-09-10 DIAGNOSIS — R55 Syncope and collapse: Secondary | ICD-10-CM | POA: Insufficient documentation

## 2017-09-10 DIAGNOSIS — R4582 Worries: Secondary | ICD-10-CM | POA: Insufficient documentation

## 2017-09-10 DIAGNOSIS — R0602 Shortness of breath: Secondary | ICD-10-CM | POA: Diagnosis not present

## 2017-09-10 DIAGNOSIS — I10 Essential (primary) hypertension: Secondary | ICD-10-CM | POA: Insufficient documentation

## 2017-09-10 DIAGNOSIS — F419 Anxiety disorder, unspecified: Secondary | ICD-10-CM | POA: Diagnosis not present

## 2017-09-10 DIAGNOSIS — R200 Anesthesia of skin: Secondary | ICD-10-CM | POA: Diagnosis not present

## 2017-09-10 DIAGNOSIS — R42 Dizziness and giddiness: Secondary | ICD-10-CM | POA: Diagnosis not present

## 2017-09-10 DIAGNOSIS — R531 Weakness: Secondary | ICD-10-CM | POA: Diagnosis present

## 2017-09-10 DIAGNOSIS — R002 Palpitations: Secondary | ICD-10-CM | POA: Insufficient documentation

## 2017-09-10 LAB — BASIC METABOLIC PANEL
Anion gap: 10 (ref 5–15)
BUN: 13 mg/dL (ref 6–20)
CALCIUM: 9.1 mg/dL (ref 8.9–10.3)
CO2: 26 mmol/L (ref 22–32)
CREATININE: 1.12 mg/dL (ref 0.61–1.24)
Chloride: 106 mmol/L (ref 101–111)
GFR calc Af Amer: >60 mL/min (ref >60)
Glucose, Bld: 90 mg/dL (ref 65–99)
Potassium: 3 mmol/L — ABNORMAL LOW (ref 3.5–5.1)
SODIUM: 142 mmol/L (ref 135–145)

## 2017-09-10 LAB — CBC
HCT: 44.3 % (ref 40.0–52.0)
Hemoglobin: 14.9 g/dL (ref 13.0–18.0)
MCH: 29.2 pg (ref 26.0–34.0)
MCHC: 33.6 g/dL (ref 32.0–36.0)
MCV: 87 fL (ref 80.0–100.0)
PLATELETS: 186 10*3/uL (ref 150–440)
RBC: 5.1 MIL/uL (ref 4.40–5.90)
RDW: 13.4 % (ref 11.5–14.5)
WBC: 3.7 10*3/uL — AB (ref 3.8–10.6)

## 2017-09-10 LAB — TROPONIN I: Troponin I: <0.03 ng/mL (ref <0.03)

## 2017-09-10 NOTE — ED Triage Notes (Signed)
Was in [place] and felt like he was going to pass out with tremors.  Per ems fsbs 71, ekg done, bp 160/104.  Has history of htn controlled by diet and exercise.  Pt has also been having some trouble with gerd with some continued soreness in upper mid abd.

## 2017-09-10 NOTE — ED Notes (Signed)
Pt provided PO challenge per EDP request.

## 2017-09-10 NOTE — ED Provider Notes (Signed)
[facility] Emergency Department Provider Note  ____________________________________________   First MD Initiated Contact with Patient 09/10/17 1318     (approximate)  I have reviewed the triage vital signs and the nursing notes.   HISTORY  Chief Complaint Weakness   HPI Dean Morton is a 47 y.o. male who comes the emergency department via EMS after a near syncopal event.  The patient has been under tremendous amount of stress recently.  He felt quite hungry this afternoon and was in the drive through the pickup [place] when he began to feel lightheaded anxious palpitations shortness of breath and "a sense of doom".  He did not fully pass out but he nearly did.  He was tremulous and shortly after feeling tremulous he began to hyperventilate and feel the fingertips on both hands go numb.  He has no family history of sudden cardiac death.  No history of coronary artery disease.  His symptoms came on suddenly last a short amount of time and quickly subsided with slow deep breathing and rest.  Past Medical History:  Diagnosis Date  . Allergy    workplace [place] (10,000 +)  . Anxiety   . Hypertension    took BP meds with panic attacks  . Pseudofolliculitis barbae     Patient Active Problem List   Diagnosis Date Noted  . Environmental and seasonal allergies 10/04/2016  . BMI 34.0-34.9,adult 10/04/2016  . Elevated blood pressure reading 10/04/2016    Past Surgical History:  Procedure Laterality Date  . UMBILICAL HERNIA REPAIR      Prior to Admission medications   Medication Sig Start Date End Date Taking? Authorizing Provider  dextromethorphan-guaiFENesin (MUCINEX DM) 30-600 MG 12hr tablet Take 1 tablet by mouth 2 (two) times daily as needed for cough.    [provider]  ePHEDrine-GuaiFENesin (BRONKAID PO) Take 1 tablet by mouth daily as needed.     [provider]    Allergies Patient has no  known allergies.  Family History  Problem Relation Age of Onset  . Hypertension Mother   . Stroke Maternal Grandmother     Social History Social History   Tobacco Use  . Smoking status: Never Smoker  . Smokeless tobacco: Never Used  Substance Use Topics  . Alcohol use: No  . Drug use: No    Review of Systems Constitutional: No fever/chills Eyes: No visual changes. ENT: No sore throat. Cardiovascular: Positive for chest pain. Respiratory: Positive for shortness of breath. Gastrointestinal: No abdominal pain.  No nausea, no vomiting.  No diarrhea.  No constipation. Genitourinary: Negative for dysuria. Musculoskeletal: Negative for back pain. Skin: Negative for rash. Neurological: Positive for bilateral hand numbness   ____________________________________________   PHYSICAL EXAM:  VITAL SIGNS: ED Triage Vitals  Enc Vitals Group     BP 09/10/17 1310 (!) 158/98     Pulse Rate 09/10/17 1310 (!) 115     Resp 09/10/17 1310 16     Temp 09/10/17 1310 97.7 F (36.5 C)     Temp Source 09/10/17 1310 Oral     SpO2 09/10/17 1310 100 %     Weight 09/10/17 1313 260 lb (117.9 kg)     Height 09/10/17 1313 6\' 1"  (1.854 m)     Head Circumference --      Peak Flow --      Pain Score 09/10/17 1312 0     Pain Loc --      Pain Edu? --  Excl. in GC? --     Constitutional: Alert and oriented x4 well-appearing nontoxic no diaphoresis speaks in full clear sentences Eyes: PERRL EOMI. Head: Atraumatic.  No tongue fasciculations Nose: No congestion/rhinnorhea. Mouth/Throat: No trismus Neck: No stridor.  Thyroid not palpable Cardiovascular: Normal rate, regular rhythm. Grossly normal heart sounds.  Good peripheral circulation. Respiratory: Normal respiratory effort.  No retractions. Lungs CTAB and moving good air Gastrointestinal: Soft nontender Musculoskeletal: No lower extremity edema   Neurologic:  Normal speech and language. No gross focal neurologic deficits are  appreciated. Skin:  Skin is warm, dry and intact. No rash noted. Psychiatric: Mood and affect are normal. Speech and behavior are normal.    ____________________________________________   DIFFERENTIAL includes but not limited to  Dehydration, panic attack, anxiety, thyrotoxicosis, ventricular tachycardia, atrial fibrillation ____________________________________________   LABS (all labs ordered are listed, but only abnormal results are displayed)  Labs Reviewed  BASIC METABOLIC PANEL - Abnormal; Notable for the following components:      Result Value   Potassium 3.0 (*)    All other components within normal limits  CBC - Abnormal; Notable for the following components:   WBC 3.7 (*)    All other components within normal limits  TROPONIN I    Lab work reviewed by me with no acute disease __________________________________________  EKG  ED ECG REPORT I, Merrily Brittle, the attending physician, personally viewed and interpreted this ECG.  Date: 09/10/2017 EKG Time:  Rate: 76 Rhythm: normal sinus rhythm QRS Axis: normal Intervals: normal ST/T Wave abnormalities: normal Narrative Interpretation: no evidence of acute ischemia  ____________________________________________  RADIOLOGY  Chest x-ray reviewed by me with no acute disease ____________________________________________   PROCEDURES  Procedure(s) performed: no  Procedures  Critical Care performed: no  Observation: no ____________________________________________   INITIAL IMPRESSION / ASSESSMENT AND PLAN / ED COURSE  Pertinent labs & imaging results that were available during my care of the patient were reviewed by me and considered in my medical decision making (see chart for details).  The patient arrives hemodynamically stable and very well-appearing.  EKG with no concerning signs for ventricular dysrhythmia.  Lab work reassuring.  He was able to eat and drink without difficulty.  Fever panic attack  versus near syncope from vasovagal episode.  He does not have a primary care physician I have encouraged him to follow-up this coming week.  He is discharged home in improved condition verbalizes understanding and agreement the plan.      ____________________________________________   FINAL CLINICAL IMPRESSION(S) / ED DIAGNOSES  Final diagnoses:  Weakness  Near syncope      NEW MEDICATIONS STARTED DURING THIS VISIT:  New Prescriptions   No medications on file     Note:  This document was prepared using Dragon voice recognition software and may include unintentional dictation errors.     Merrily Brittle, MD 09/10/17 [phone]

## 2017-09-10 NOTE — Discharge Instructions (Signed)
Fortunately today your chest x-ray, your EKG, and your blood work were very reassuring.  Please make an appointment to establish care with a primary care physician this coming week for reevaluation return to the emergency department for any concerns.  It was a pleasure to take care of you today, and thank you for coming to our emergency department.  If you have any questions or concerns before leaving please ask the nurse to grab me and I'm more than happy to go through your aftercare instructions again.  If you were prescribed any opioid pain medication today such as Norco, Vicodin, Percocet, morphine, hydrocodone, or oxycodone please make sure you do not drive when you are taking this medication as it can alter your ability to drive safely.  If you have any concerns once you are home that you are not improving or are in fact getting worse before you can make it to your follow-up appointment, please do not hesitate to call 911 and come back for further evaluation.  Merrily BrittleNeil Harmoni Lucus, MD  Results for orders placed or performed during the hospital encounter of 09/10/17  Basic metabolic panel  Result Value Ref Range   Sodium 142 135 - 145 mmol/L   Potassium 3.0 (L) 3.5 - 5.1 mmol/L   Chloride 106 101 - 111 mmol/L   CO2 26 22 - 32 mmol/L   Glucose, Bld 90 65 - 99 mg/dL   BUN 13 6 - 20 mg/dL   Creatinine, Ser 2.841.12 0.61 - 1.24 mg/dL   Calcium 9.1 8.9 - 13.210.3 mg/dL   GFR calc non Af Amer >60 >60 mL/min   GFR calc Af Amer >60 >60 mL/min   Anion gap 10 5 - 15  CBC  Result Value Ref Range   WBC 3.7 (L) 3.8 - 10.6 K/uL   RBC 5.10 4.40 - 5.90 MIL/uL   Hemoglobin 14.9 13.0 - 18.0 g/dL   HCT 44.044.3 10.240.0 - 72.552.0 %   MCV 87.0 80.0 - 100.0 fL   MCH 29.2 26.0 - 34.0 pg   MCHC 33.6 32.0 - 36.0 g/dL   RDW 36.613.4 44.011.5 - 34.714.5 %   Platelets 186 150 - 440 K/uL  Troponin I  Result Value Ref Range   Troponin I <0.03 <0.03 ng/mL   Dg Chest 2 View  Result Date: 09/10/2017 CLINICAL DATA:  Patient with upper  abdominal pain. EXAM: CHEST - 2 VIEW COMPARISON:  Chest radiograph 05/28/2017. FINDINGS: Normal cardiac and mediastinal contours. No consolidative pulmonary opacities. No pleural effusion pneumothorax. Regional skeleton is unremarkable. IMPRESSION: No acute cardiopulmonary process. Electronically Signed   By: Annia Beltrew  Davis M.D.   On: 09/10/2017 13:38

## 2018-04-01 IMAGING — CR DG CHEST 2V
1 series · 2 of 2 positions shown · non-contrast
Comparison: None.

CLINICAL DATA: Chest pain when eating.

EXAM:
CHEST  2 VIEW

[Series 1: dg chest 2 view · 0.14mm/px · 2 of 2 slices shown]
[im 1/2]
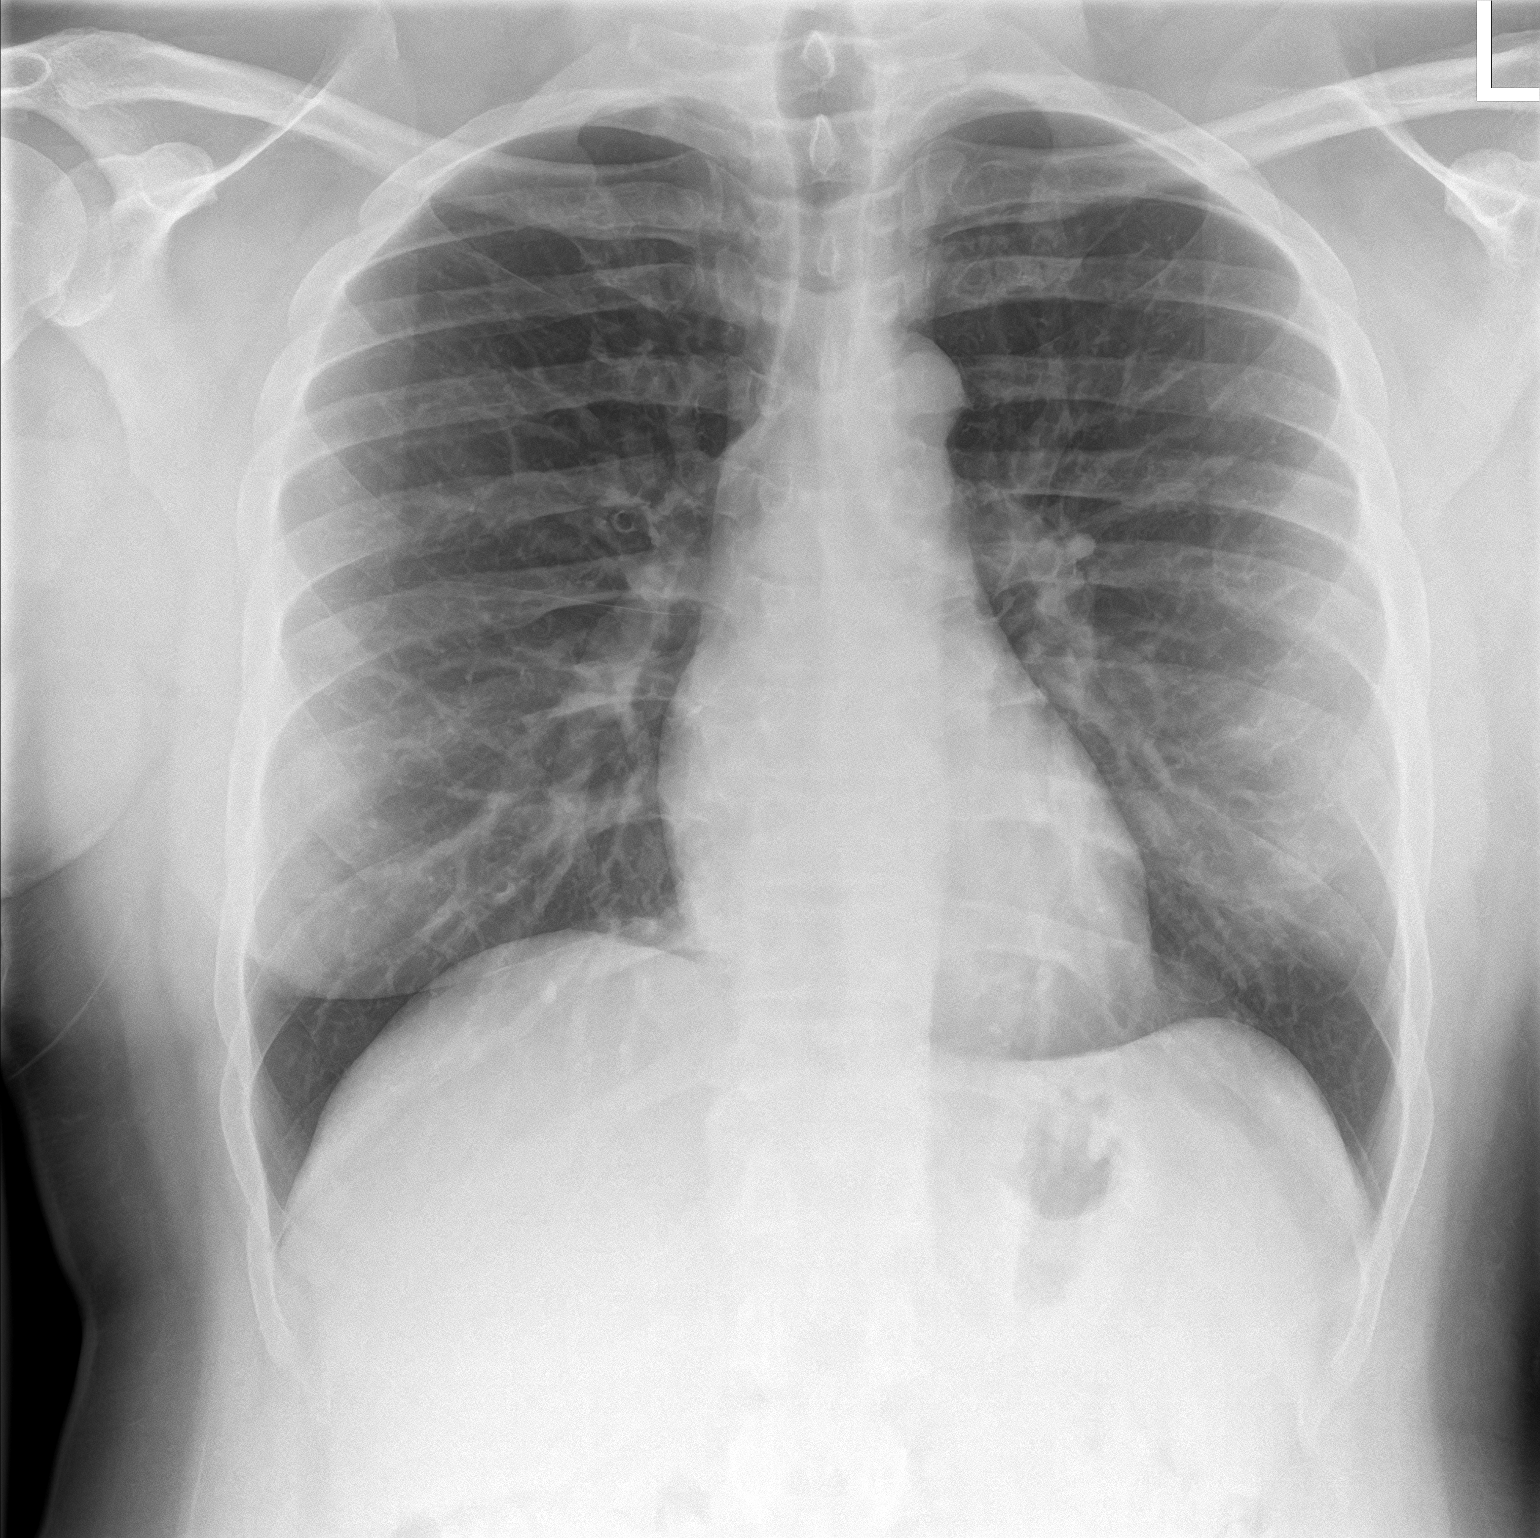
[im 2/2]
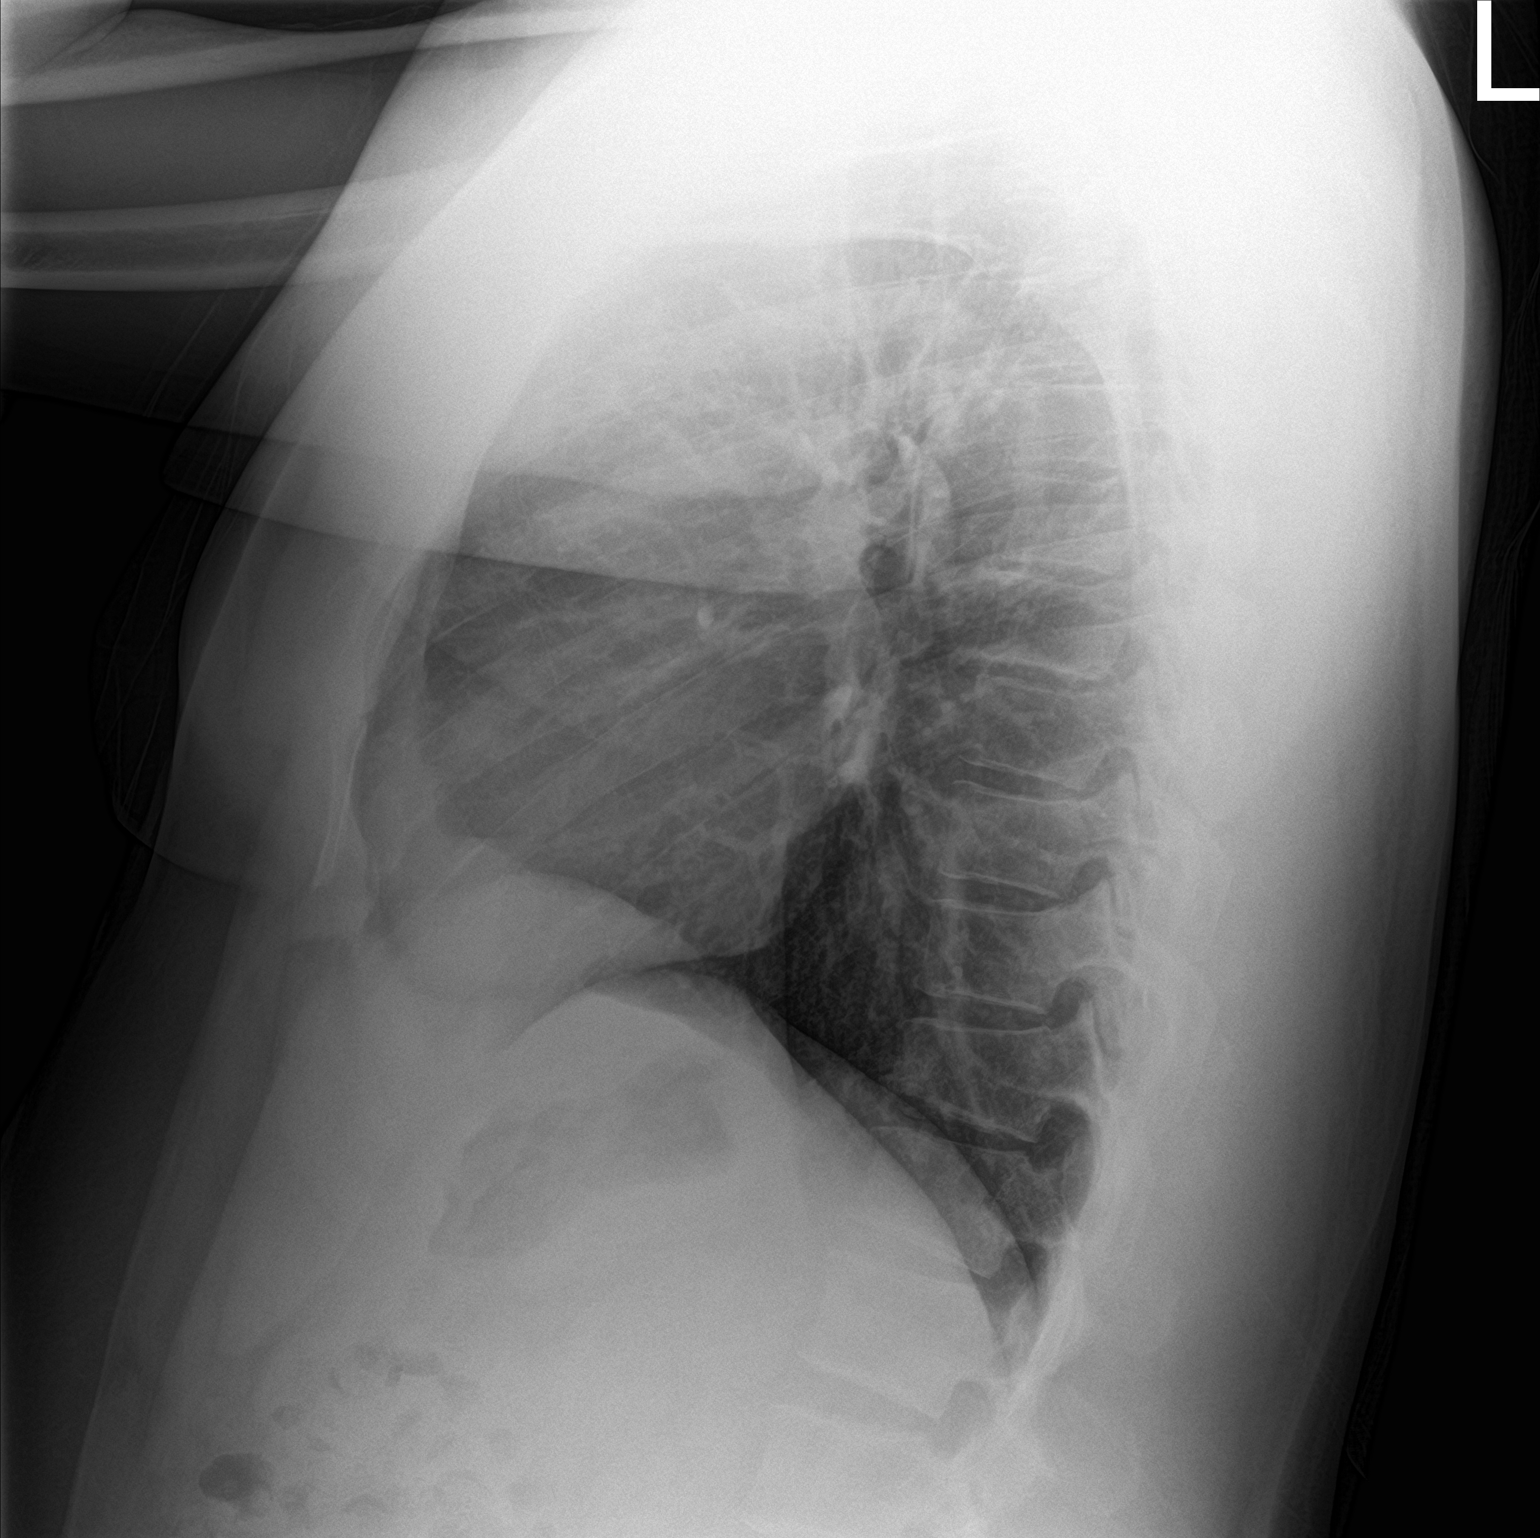

[2 of 2 positions shown; findings below may reference images not displayed]

FINDINGS: The cardiomediastinal silhouette is normal in size. Normal pulmonary
vascularity. No focal consolidation, pleural effusion, or
pneumothorax. Mild age-indeterminate wedging of a lower thoracic
vertebral body, likely T11.
IMPRESSION: 1.  No active cardiopulmonary disease.
2. Mild age-indeterminate wedging of a lower thoracic vertebral
body, likely T11. This is likely chronic in the absence of recent
trauma. Correlate with point tenderness.

## 2019-05-13 ENCOUNTER — Ambulatory Visit: Payer: Self-pay

## 2019-06-06 ENCOUNTER — Ambulatory Visit: Payer: Self-pay

## 2019-06-06 ENCOUNTER — Other Ambulatory Visit: Payer: Self-pay

## 2019-06-06 DIAGNOSIS — Z0000 Encounter for general adult medical examination without abnormal findings: Secondary | ICD-10-CM

## 2019-06-06 NOTE — Progress Notes (Signed)
[employer] for [employer].  Wasn't listed in [place].  Presents to discuss BBP's.  States was in [employer], worked at [employer] he's had the Hep B vaccine series.  Decided to check Hep B titer.  Asked about Tetanus status.  Unsure of last shot.  Declined offer to administer booster today.  Advised him that he can change his mind & we'll administer for him.  AMD

## 2019-06-07 ENCOUNTER — Ambulatory Visit: Payer: Self-pay

## 2019-06-07 LAB — HEPATITIS B SURFACE ANTIBODY,QUALITATIVE: Hep B Surface Ab, Qual: NONREACTIVE

## 2019-06-21 ENCOUNTER — Telehealth: Payer: Self-pay

## 2019-06-21 NOTE — Telephone Encounter (Signed)
-----   Message from Olen Cordial, NP sent at 06/07/2019  2:05 PM EST ----- Nonreactive will need vaccination performed.  My chart message sent to patient to contact [facility] and schedule vaccination appts with RN Raenette Rover.

## 2019-06-21 NOTE — Telephone Encounter (Signed)
JFanny Dance (Admin Asst) scheduled [name] to come to clinic 06/22/2019 for Hep B booster.  AMD

## 2019-06-22 ENCOUNTER — Ambulatory Visit: Payer: Managed Care, Other (non HMO)

## 2019-06-22 ENCOUNTER — Other Ambulatory Visit: Payer: Self-pay

## 2019-06-22 DIAGNOSIS — Z23 Encounter for immunization: Secondary | ICD-10-CM

## 2019-06-22 NOTE — Progress Notes (Signed)
Hep B titer from 06/06/2019 is non-reactive. Gerarda Fraction, NP-C (Interim Provider) reviewed the results & recommended a  Hepatitis B Booster injection.  Hepatitis B booster administered today.  6 wk follow-up Hepatitis B titer scheduled for 08/03/2019.  AMD

## 2019-08-03 ENCOUNTER — Other Ambulatory Visit: Payer: Self-pay

## 2019-08-03 DIAGNOSIS — Z1159 Encounter for screening for other viral diseases: Secondary | ICD-10-CM

## 2019-08-03 NOTE — Progress Notes (Signed)
Patient is here today for a hepatitis B titer.

## 2019-08-04 ENCOUNTER — Telehealth: Payer: Self-pay

## 2019-08-04 LAB — HEPATITIS B SURFACE ANTIBODY,QUALITATIVE: Hep B Surface Ab, Qual: REACTIVE

## 2019-08-04 NOTE — Telephone Encounter (Signed)
Notified Deontrey that Hep B titer is reactive & no further Hepatitis B vaccine needed at this time.  AMD

## 2021-04-14 DIAGNOSIS — U071 COVID-19: Secondary | ICD-10-CM | POA: Diagnosis not present

## 2021-04-14 DIAGNOSIS — R509 Fever, unspecified: Secondary | ICD-10-CM | POA: Diagnosis not present

## 2021-04-16 DIAGNOSIS — U071 COVID-19: Secondary | ICD-10-CM | POA: Diagnosis not present

## 2021-04-16 DIAGNOSIS — J40 Bronchitis, not specified as acute or chronic: Secondary | ICD-10-CM | POA: Diagnosis not present

## 2021-04-19 DIAGNOSIS — J40 Bronchitis, not specified as acute or chronic: Secondary | ICD-10-CM | POA: Diagnosis not present

## 2021-04-19 DIAGNOSIS — U071 COVID-19: Secondary | ICD-10-CM | POA: Diagnosis not present

## 2021-04-19 DIAGNOSIS — R0981 Nasal congestion: Secondary | ICD-10-CM | POA: Diagnosis not present

## 2021-04-28 DIAGNOSIS — R051 Acute cough: Secondary | ICD-10-CM | POA: Diagnosis not present

## 2021-04-28 DIAGNOSIS — J019 Acute sinusitis, unspecified: Secondary | ICD-10-CM | POA: Diagnosis not present

## 2021-04-28 DIAGNOSIS — Z8616 Personal history of COVID-19: Secondary | ICD-10-CM | POA: Diagnosis not present

## 2022-02-15 ENCOUNTER — Encounter: Payer: Self-pay | Admitting: Radiology

## 2022-02-15 ENCOUNTER — Emergency Department: Payer: BC Managed Care – PPO

## 2022-02-15 ENCOUNTER — Other Ambulatory Visit: Payer: Self-pay

## 2022-02-15 DIAGNOSIS — R239 Unspecified skin changes: Secondary | ICD-10-CM | POA: Diagnosis not present

## 2022-02-15 DIAGNOSIS — I739 Peripheral vascular disease, unspecified: Secondary | ICD-10-CM | POA: Diagnosis not present

## 2022-02-15 DIAGNOSIS — Z0389 Encounter for observation for other suspected diseases and conditions ruled out: Secondary | ICD-10-CM | POA: Diagnosis not present

## 2022-02-15 DIAGNOSIS — I7300 Raynaud's syndrome without gangrene: Secondary | ICD-10-CM | POA: Insufficient documentation

## 2022-02-15 DIAGNOSIS — I10 Essential (primary) hypertension: Secondary | ICD-10-CM | POA: Diagnosis not present

## 2022-02-15 LAB — CBC WITH DIFFERENTIAL/PLATELET
Abs Immature Granulocytes: 0.01 10*3/uL (ref 0.00–0.07)
Basophils Absolute: 0 10*3/uL (ref 0.0–0.1)
Basophils Relative: 1 %
Eosinophils Absolute: 0.1 10*3/uL (ref 0.0–0.5)
Eosinophils Relative: 2 %
HCT: 43.4 % (ref 39.0–52.0)
Hemoglobin: 14.6 g/dL (ref 13.0–17.0)
Immature Granulocytes: 0 %
Lymphocytes Relative: 44 %
Lymphs Abs: 2.3 10*3/uL (ref 0.7–4.0)
MCH: 28.9 pg (ref 26.0–34.0)
MCHC: 33.6 g/dL (ref 30.0–36.0)
MCV: 85.8 fL (ref 80.0–100.0)
Monocytes Absolute: 0.4 10*3/uL (ref 0.1–1.0)
Monocytes Relative: 9 %
Neutro Abs: 2.2 10*3/uL (ref 1.7–7.7)
Neutrophils Relative %: 44 %
Platelets: 201 10*3/uL (ref 150–400)
RBC: 5.06 MIL/uL (ref 4.22–5.81)
RDW: 12.3 % (ref 11.5–15.5)
WBC: 5 10*3/uL (ref 4.0–10.5)
nRBC: 0 % (ref 0.0–0.2)

## 2022-02-15 LAB — COMPREHENSIVE METABOLIC PANEL
ALT: 16 U/L (ref 0–44)
AST: 29 U/L (ref 15–41)
Albumin: 4.3 g/dL (ref 3.5–5.0)
Alkaline Phosphatase: 66 U/L (ref 38–126)
Anion gap: 7 (ref 5–15)
BUN: 18 mg/dL (ref 6–20)
CO2: 25 mmol/L (ref 22–32)
Calcium: 8.9 mg/dL (ref 8.9–10.3)
Chloride: 110 mmol/L (ref 98–111)
Creatinine, Ser: 1.24 mg/dL (ref 0.61–1.24)
GFR, Estimated: >60 mL/min (ref >60)
Glucose, Bld: 92 mg/dL (ref 70–99)
Potassium: 3.3 mmol/L — ABNORMAL LOW (ref 3.5–5.1)
Sodium: 142 mmol/L (ref 135–145)
Total Bilirubin: 0.7 mg/dL (ref 0.3–1.2)
Total Protein: 7.6 g/dL (ref 6.5–8.1)

## 2022-02-15 LAB — LACTIC ACID, PLASMA: Lactic Acid, Venous: 1.3 mmol/L (ref 0.5–1.9)

## 2022-02-15 LAB — PROTIME-INR
INR: 1 (ref 0.8–1.2)
Prothrombin Time: 13 seconds (ref 11.4–15.2)

## 2022-02-15 MED ORDER — IOHEXOL 350 MG/ML SOLN
125.0000 mL | Freq: Once | INTRAVENOUS | Status: AC | PRN
  Administered 2022-02-15: 125 mL via INTRAVENOUS

## 2022-02-15 NOTE — ED Triage Notes (Signed)
Pt with left hand discoloration that began tonight. Pt has dye light green that can be wiped off with alcohol, but had continues to remain slightly discolored. Pt states had may have felt different. Cms intact

## 2022-02-15 NOTE — ED Provider Notes (Signed)
Emergency Medicine Provider Triage Evaluation Note  Dean Morton , a 51 y.o. male  was evaluated in triage.  Pt complains of left hand change in color, subjective "cool" sensation.  Review of Systems  Positive: Skin color change. Negative: No weakness, numbness, muscle pain, neck pain, chest pain.  Physical Exam  BP (!) 165/94   Pulse 82   Resp 16   Ht 6\' 1"  (1.854 m)   Wt 127 kg   SpO2 100%   BMI 36.94 kg/m  Gen:   Awake, no distress   Resp:  Normal effort  MSK:   Moves extremities without difficulty  Other:  Moderate cyanosis diffusely of left palm/hand, though cap refill intact and 2+ radial pulse is noted. Compartments are soft.  Medical Decision Making  Medically screening exam initiated at 10:53 PM.  Appropriate orders placed.  was informed that the remainder of the evaluation will be completed by another provider, this initial triage assessment does not replace that evaluation, and the importance of remaining in the ED until their evaluation is complete.  Patient will go for stat CT Angio of LUE. No pain to suggest active significant ischemia but exam concerning. Denies any paint/dye exposure and unable to wash off with alcohol pad.   Percival Spanish, MD 02/15/22 2257

## 2022-02-15 NOTE — ED Notes (Signed)
Dr. Erma Heritage into triage to evaluate pt and place appropriate orders.

## 2022-02-16 ENCOUNTER — Emergency Department
Admission: EM | Admit: 2022-02-16 | Discharge: 2022-02-16 | Disposition: A | Discharge status: Home / Self Care | Payer: BC Managed Care – PPO | Attending: Emergency Medicine | Admitting: Emergency Medicine

## 2022-02-16 DIAGNOSIS — R230 Cyanosis: Secondary | ICD-10-CM

## 2022-02-16 DIAGNOSIS — I7300 Raynaud's syndrome without gangrene: Secondary | ICD-10-CM

## 2022-02-16 DIAGNOSIS — I739 Peripheral vascular disease, unspecified: Secondary | ICD-10-CM

## 2022-02-16 NOTE — ED Provider Notes (Signed)
[facility] Provider Note    Event Date/Time   First MD Initiated Contact with Patient 02/16/22 0205     (approximate)   History   hand discoloration   HPI  Dean Morton is a 51 y.o. male who presents to the ED for evaluation of hand discoloration   I review a clinic visit from 1 year ago where patient was evaluated for a cough.  He is largely a healthy patient without chronic medical conditions.  He denies smoking history, recreational drugs.  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   He presents to the ED for evaluation of palmar discoloration on his left hand.  He reports he was watching a movie this evening with family.  After the movie finished, around 10 PM, he looked at his left palm and noticed it was discolored with blue, green or gray discoloration from the typical pain cut his palm.  Reports it was from his mid hand distally throughout his fingers.  He reports some mild paresthesias alongside this without any pain, notable injuries or trauma.  He questions if he was clenching his hand during the movie "as it was really good."  But the time I see him it has been a few hours since this initial discoloration, he reports he feels fine and his wife reports it looks much better than when she first sought.  This has never happened before.   Physical Exam   Triage Vital Signs: ED Triage Vitals  Enc Vitals Group     BP 02/15/22 2234 (!) 165/94     Pulse Rate 02/15/22 2234 82     Resp 02/15/22 2234 16     Temp --      Temp Source 02/15/22 2234 Oral     SpO2 02/15/22 2234 100 %     Weight 02/15/22 2235 280 lb (127 kg)     Height 02/15/22 2235 6\' 1"  (1.854 m)     Head Circumference --      Peak Flow --      Pain Score 02/15/22 2235 0     Pain Loc --      Pain Edu? --      Excl. in [place]? --     Most recent vital signs: Vitals:   02/16/22 0039 02/16/22 0316  BP: (!) 152/93 126/80  Pulse: 78 80  Resp: 17 16  SpO2: 99%      General: Awake, no distress.  CV:  Good peripheral perfusion.  Strong and symmetric 2+ radial pulses bilaterally. Resp:  Normal effort.  Abd:  No distention.  MSK:  No deformity noted.  Neuro:  No focal deficits appreciated. Other:  Faint but noticeable ashen discoloration to the skin overlying the middle and proximal phalanx to the fingers of the left hand.  Distal tips of all 5 fingers are pink with brisk capillary refill.  No discoloration noted to the palm.  Sensation to soft touch intact throughout as well as strength in all distributions.  Top picture was taken by me at the bedside Bottom picture is taken by my colleague in triage, at the time of their PIT note a couple hours prior to my evaluation..        ED Results / Procedures / Treatments   Labs (all labs ordered are listed, but only abnormal results are displayed) Labs Reviewed  COMPREHENSIVE METABOLIC PANEL - Abnormal; Notable for the following components:  Result Value   Potassium 3.3 (*)    All other components within normal limits  CBC WITH DIFFERENTIAL/PLATELET  PROTIME-INR  LACTIC ACID, PLASMA  LACTIC ACID, PLASMA    EKG   RADIOLOGY CTA of the left arm interpreted by me without evidence of arterial ischemia or embolism  Official radiology report(s): CT ANGIO UP EXTREM LEFT W &/OR WO CONTAST  Result Date: 02/15/2022 CLINICAL DATA:  Concern for upper extremity artery dissection; complaint of left and changing color and cool sensation EXAM: CTA OF THE UPPER LEFT EXTREMITY WITH CONTRAST TECHNIQUE: Multidetector CT imaging of the upper left extremity was performed according to the standard protocol following intravenous contrast administration. RADIATION DOSE REDUCTION: This exam was performed according to the departmental dose-optimization program which includes automated exposure control, adjustment of the mA and/or kV according to patient size and/or use of iterative reconstruction technique.  CONTRAST:  120mL OMNIPAQUE IOHEXOL 350 MG/ML SOLN COMPARISON:  None Available. FINDINGS: Vasculature: Aortic arch: No evidence of aneurysm, dissection or vasculitis. Subclavian artery: Widely patent. No acute luminal abnormality. No aneurysm or ectasia. Axillary artery: Widely patent. No acute luminal abnormality. No aneurysm or ectasia. Normal appearance of the lateral thoracic and subscapular branches. Brachial artery: Widely patent. No acute luminal abnormality. No aneurysm or ectasia. Radial artery: Widely patent. No acute luminal abnormality. No aneurysm or ectasia. Ulnar artery: Widely patent. No acute luminal abnormality. No aneurysm or ectasia. Bones/Joint/Cartilage Unremarkable. Ligaments Suboptimally assessed by CT. Muscles and Tendons Unremarkable. Soft tissues Unremarkable. Review of the MIP images confirms these findings. IMPRESSION: Unremarkable left upper extremity runoff. Electronically Signed   By: Placido Sou M.D.   On: 02/15/2022 23:50    PROCEDURES and INTERVENTIONS:  Procedures  Medications  iohexol (OMNIPAQUE) 350 MG/ML injection 125 mL (125 mLs Intravenous Contrast Given 02/15/22 2306)     IMPRESSION / MDM / ASSESSMENT AND PLAN / ED COURSE  I reviewed the triage vital signs and the nursing notes.  Differential diagnosis includes, but is not limited to, vasospasm or Raynaud's phenomenon, positional compression or ischemia, embolism, trauma, infection  {Patient presents with symptoms of an acute illness or injury that is potentially life-threatening.  Pleasant 51 year old male without significant medical history presents with resolving discoloration of his left palm, possibly vasospasm versus a positional arterial compression, with a reassuring work-up and ultimately suitable for outpatient management.  He look systemically well, does not have any pain and no history of trauma, has some resolving paresthesias but primarily discoloration of the skin, as pictured above.  CT  angiogram is reassuring as well as screening blood work.  Normal coagulation panel, lactic acid and CBC.  I doubt continued ischemia or pathology as his symptoms have essentially resolved.  Stronger suspicion for a positional compression, but a idiopathic Raynaud's phenomenon or vasospasm is certainly a possibility.  I discussed this with the patient and after discussing risks and benefits we decided not to start any calcium channel blockers but to follow his symptoms closely and follow-up with the PCP.  We discussed appropriate return precautions for the ED and he is suitable for outpatient management.  I considered observation admission for this patient.      FINAL CLINICAL IMPRESSION(S) / ED DIAGNOSES   Final diagnoses:  Vasospasm of peripheral artery (HCC)  Extremity cyanosis  Raynaud's phenomenon without gangrene     Rx / DC Orders   ED Discharge Orders     None        Note:  This document was prepared using  [employer] and may include unintentional dictation errors.   Delton Prairie, MD 02/16/22 [phone]

## 2022-02-16 NOTE — Discharge Instructions (Addendum)
As we discussed, you likely had a transient episode of tight blood vessels or vasospasm in your hand.  This could have happened from something positional or related to a primary process like Raynaud's phenomenon.  If this happens again, stays this way or has severe pain then please return to the ED.

## 2022-03-14 DIAGNOSIS — R590 Localized enlarged lymph nodes: Secondary | ICD-10-CM | POA: Diagnosis not present

## 2022-03-14 DIAGNOSIS — M542 Cervicalgia: Secondary | ICD-10-CM | POA: Diagnosis not present

## 2022-04-03 DIAGNOSIS — R079 Chest pain, unspecified: Secondary | ICD-10-CM | POA: Diagnosis not present

## 2022-04-15 DIAGNOSIS — I10 Essential (primary) hypertension: Secondary | ICD-10-CM | POA: Diagnosis not present

## 2022-04-15 DIAGNOSIS — R0982 Postnasal drip: Secondary | ICD-10-CM | POA: Diagnosis not present

## 2022-06-27 DIAGNOSIS — M545 Low back pain, unspecified: Secondary | ICD-10-CM | POA: Diagnosis not present

## 2022-07-18 DIAGNOSIS — M7541 Impingement syndrome of right shoulder: Secondary | ICD-10-CM | POA: Diagnosis not present

## 2022-07-21 ENCOUNTER — Emergency Department: Payer: BC Managed Care – PPO

## 2022-07-21 ENCOUNTER — Other Ambulatory Visit: Payer: Self-pay

## 2022-07-21 ENCOUNTER — Emergency Department
Admission: EM | Admit: 2022-07-21 | Discharge: 2022-07-21 | Disposition: A | Discharge status: Home / Self Care | Payer: BC Managed Care – PPO | Attending: Student in an Organized Health Care Education/Training Program | Admitting: Student in an Organized Health Care Education/Training Program

## 2022-07-21 DIAGNOSIS — K2960 Other gastritis without bleeding: Secondary | ICD-10-CM | POA: Diagnosis not present

## 2022-07-21 DIAGNOSIS — R079 Chest pain, unspecified: Secondary | ICD-10-CM | POA: Diagnosis not present

## 2022-07-21 DIAGNOSIS — R109 Unspecified abdominal pain: Secondary | ICD-10-CM | POA: Diagnosis not present

## 2022-07-21 DIAGNOSIS — K2900 Acute gastritis without bleeding: Secondary | ICD-10-CM

## 2022-07-21 DIAGNOSIS — R1013 Epigastric pain: Secondary | ICD-10-CM | POA: Diagnosis not present

## 2022-07-21 LAB — CBC WITH DIFFERENTIAL/PLATELET
Abs Immature Granulocytes: 0.01 10*3/uL (ref 0.00–0.07)
Basophils Absolute: 0 10*3/uL (ref 0.0–0.1)
Basophils Relative: 1 %
Eosinophils Absolute: 0.1 10*3/uL (ref 0.0–0.5)
Eosinophils Relative: 2 %
HCT: 43.1 % (ref 39.0–52.0)
Hemoglobin: 14.6 g/dL (ref 13.0–17.0)
Immature Granulocytes: 0 %
Lymphocytes Relative: 46 %
Lymphs Abs: 1.8 10*3/uL (ref 0.7–4.0)
MCH: 29.1 pg (ref 26.0–34.0)
MCHC: 33.9 g/dL (ref 30.0–36.0)
MCV: 85.9 fL (ref 80.0–100.0)
Monocytes Absolute: 0.3 10*3/uL (ref 0.1–1.0)
Monocytes Relative: 6 %
Neutro Abs: 1.7 10*3/uL (ref 1.7–7.7)
Neutrophils Relative %: 45 %
Platelets: 188 10*3/uL (ref 150–400)
RBC: 5.02 MIL/uL (ref 4.22–5.81)
RDW: 12.2 % (ref 11.5–15.5)
WBC: 3.9 10*3/uL — ABNORMAL LOW (ref 4.0–10.5)
nRBC: 0 % (ref 0.0–0.2)

## 2022-07-21 LAB — COMPREHENSIVE METABOLIC PANEL WITH GFR
ALT: 17 U/L (ref 0–44)
AST: 26 U/L (ref 15–41)
Albumin: 4.6 g/dL (ref 3.5–5.0)
Alkaline Phosphatase: 54 U/L (ref 38–126)
Anion gap: 6 (ref 5–15)
BUN: 12 mg/dL (ref 6–20)
CO2: 27 mmol/L (ref 22–32)
Calcium: 9.2 mg/dL (ref 8.9–10.3)
Chloride: 106 mmol/L (ref 98–111)
Creatinine, Ser: 0.94 mg/dL (ref 0.61–1.24)
GFR, Estimated: >60 mL/min
Glucose, Bld: 97 mg/dL (ref 70–99)
Potassium: 3.4 mmol/L — ABNORMAL LOW (ref 3.5–5.1)
Sodium: 139 mmol/L (ref 135–145)
Total Bilirubin: 0.7 mg/dL (ref 0.3–1.2)
Total Protein: 7.5 g/dL (ref 6.5–8.1)

## 2022-07-21 LAB — LIPASE, BLOOD: Lipase: 29 U/L (ref 11–51)

## 2022-07-21 MED ORDER — FAMOTIDINE 20 MG PO TABS
20.0000 mg | ORAL_TABLET | Freq: Once | ORAL | Status: AC
  Administered 2022-07-21: 20 mg via ORAL
  Filled 2022-07-21: qty 1

## 2022-07-21 MED ORDER — FAMOTIDINE 20 MG PO TABS: 20.0000 mg | ORAL_TABLET | Freq: Every day | ORAL | 0 refills | Status: DC

## 2022-07-21 MED ORDER — PANTOPRAZOLE SODIUM 40 MG PO TBEC
40.0000 mg | DELAYED_RELEASE_TABLET | Freq: Once | ORAL | Status: AC
  Administered 2022-07-21: 40 mg via ORAL
  Filled 2022-07-21: qty 1

## 2022-07-21 MED ORDER — OMEPRAZOLE 10 MG PO CPDR: 10.0000 mg | DELAYED_RELEASE_CAPSULE | Freq: Every day | ORAL | 0 refills | Status: DC

## 2022-07-21 NOTE — ED Provider Notes (Signed)
[facility] Provider Note  Patient Contact: 9:31 PM (approximate)   History   Abdominal Pain   HPI  Dean Morton is a 52 y.o. male who presents the emergency department complaining of some epigastric pain.  Patient states that he has had gastritis in the past and this feels similar.  He states that he was taking 800 mg ibuprofen for musculoskeletal back and shoulder pain.  Patient denies any emesis, diarrhea, constipation.  No fevers or chills.  No cough, congestion.     Physical Exam   Triage Vital Signs: ED Triage Vitals  Enc Vitals Group     BP 07/21/22 1928 (!) 148/100     Pulse Rate 07/21/22 1928 79     Resp 07/21/22 1928 14     Temp 07/21/22 1928 97.8 F (36.6 C)     Temp Source 07/21/22 1928 Oral     SpO2 07/21/22 1928 98 %     Weight 07/21/22 1922 283 lb (128.4 kg)     Height 07/21/22 1922 6\' 1"  (1.854 m)     Head Circumference --      Peak Flow --      Pain Score 07/21/22 1922 0     Pain Loc --      Pain Edu? --      Excl. in [place]? --     Most recent vital signs: Vitals:   07/21/22 1928  BP: (!) 148/100  Pulse: 79  Resp: 14  Temp: 97.8 F (36.6 C)  SpO2: 98%     General: Alert and in no acute distress.  Cardiovascular:  Good peripheral perfusion Respiratory: Normal respiratory effort without tachypnea or retractions. Lungs CTAB.  Gastrointestinal: Bowel sounds 4 quadrants. Soft and nontender to palpation. No guarding or rigidity. No palpable masses. No distention. No CVA tenderness. Musculoskeletal: Full range of motion to all extremities.  Neurologic:  No gross focal neurologic deficits are appreciated.  Skin:   No rash noted Other:   ED Results / Procedures / Treatments   Labs (all labs ordered are listed, but only abnormal results are displayed) Labs Reviewed  CBC WITH DIFFERENTIAL/PLATELET - Abnormal; Notable for the following components:      Result Value   WBC 3.9 (*)    All other components within normal  limits  COMPREHENSIVE METABOLIC PANEL - Abnormal; Notable for the following components:   Potassium 3.4 (*)    All other components within normal limits  LIPASE, BLOOD     EKG     RADIOLOGY  I personally viewed, evaluated, and interpreted these images as part of my medical decision making, as well as reviewing the written report by the radiologist.  ED Provider Interpretation: No acute cardiopulmonary findings on chest x-ray  DG Chest 2 View  Result Date: 07/21/2022 CLINICAL DATA:  Left upper abdominal pain. EXAM: CHEST - 2 VIEW COMPARISON:  Chest radiograph 09/10/2017 FINDINGS: The cardiomediastinal silhouette is normal. There is no focal consolidation or pulmonary edema. There is no pleural effusion or pneumothorax There is no displaced rib fracture or other acute osseous abnormality. IMPRESSION: No radiographic evidence of acute cardiopulmonary process. Electronically Signed   By: Valetta Mole M.D.   On: 07/21/2022 20:04    PROCEDURES:  Critical Care performed: No  Procedures   MEDICATIONS ORDERED IN ED: Medications  pantoprazole (PROTONIX) EC tablet 40 mg (has no administration in time range)  famotidine (PEPCID) tablet 20 mg (has no administration in time range)  IMPRESSION / MDM / ASSESSMENT AND PLAN / ED COURSE  I reviewed the triage vital signs and the nursing notes.                                 Differential diagnosis includes, but is not limited to, patient was closely observed gastritis, cholecystitis, pancreatitis, gastric ulcer.  Patient's presentation is most consistent with acute presentation with potential threat to life or bodily function.   Patient's diagnosis is consistent with gastritis.  Patient presents emergency department with mild epigastric abdominal pain.  No emesis, diarrhea.  No other GI complaints.  Patient states that he has been using large doses of ibuprofen due to musculoskeletal pain.  He has gastritis in the past and this feels  similar.  At this time we discussed reassuring labs.  Imaging was also discussed and after lengthy discussion patient opts for treatment of gastritis versus presenting with a CT scan.  I feel this is reasonable as I have a low suspicion for gastric perforation or other acute abdominal pathology currently.  Patient will be placed on symptom control medications.  Return precautions given for any worsening symptoms and we will order imaging at that time if needed.  Patient is agreeable with this plan.  Patient have famotidine and omeprazole..  Follow-up primary care as needed.  Patient is given ED precautions to return to the ED for any worsening or new symptoms.     FINAL CLINICAL IMPRESSION(S) / ED DIAGNOSES   Final diagnoses:  Other acute gastritis without hemorrhage     Rx / DC Orders   ED Discharge Orders          Ordered    omeprazole (PRILOSEC) 10 MG capsule  Daily        07/21/22 2138    famotidine (PEPCID) 20 MG tablet  Daily        07/21/22 2138             Note:  This document was prepared using Dragon voice recognition software and may include unintentional dictation errors.   Brynda Peon 07/21/22 2138    Merlyn Lot, MD 07/21/22 2158

## 2022-07-21 NOTE — ED Triage Notes (Signed)
Pt reports similar tenderness 1 year ago and had an xray done that came back clear per patient.

## 2022-07-21 NOTE — ED Triage Notes (Signed)
Pt reports left upper abdominal pain starting Saturday morning. Denies N/V. Pain is only present with palpation. Denies SOB, Fever/CP.

## 2022-07-28 ENCOUNTER — Ambulatory Visit (INDEPENDENT_AMBULATORY_CARE_PROVIDER_SITE_OTHER): Payer: BC Managed Care – PPO | Admitting: Family Medicine

## 2022-07-28 ENCOUNTER — Encounter: Payer: Self-pay | Admitting: Family Medicine

## 2022-07-28 VITALS — BP 138/84 | HR 77 | Ht 73.0 in | Wt 268.0 lb

## 2022-07-28 DIAGNOSIS — E669 Obesity, unspecified: Secondary | ICD-10-CM | POA: Diagnosis not present

## 2022-07-28 DIAGNOSIS — Z7689 Persons encountering health services in other specified circumstances: Secondary | ICD-10-CM

## 2022-07-28 DIAGNOSIS — K219 Gastro-esophageal reflux disease without esophagitis: Secondary | ICD-10-CM

## 2022-07-28 DIAGNOSIS — K2900 Acute gastritis without bleeding: Secondary | ICD-10-CM

## 2022-07-28 DIAGNOSIS — M940 Chondrocostal junction syndrome [Tietze]: Secondary | ICD-10-CM

## 2022-07-28 DIAGNOSIS — R030 Elevated blood-pressure reading, without diagnosis of hypertension: Secondary | ICD-10-CM | POA: Diagnosis not present

## 2022-07-28 MED ORDER — BACLOFEN 10 MG PO TABS: 5.0000 mg | ORAL_TABLET | Freq: Three times a day (TID) | ORAL | 1 refills | Status: DC | PRN

## 2022-07-28 MED ORDER — OMEPRAZOLE 40 MG PO CPDR: 40.0000 mg | DELAYED_RELEASE_CAPSULE | Freq: Every day | ORAL | 2 refills | Status: DC

## 2022-07-28 NOTE — Patient Instructions (Addendum)
Thank you for coming to the office today.  Most likely is cartilage / rib contusion or musculoskeletal injury as discussed.  Possible stomach acid gastritis problem due to the Ibuprofen  Stop the Omeprazole 10mg  and Famotidine 20mg   START new rx Omeprazole 40mg  daily before breakfast for 4 weeks, then taper down every other day, and on to the 10 mg dosage and then again phase down and OFF this over another 1-2 weeks. Avoid the rebound effect.  ---------------------------  START anti inflammatory topical - OTC Voltaren (generic Diclofenac) topical 2-4 times a day as needed for pain swelling of affected joint for 1-2 weeks or longer.  No more ibuprofen  Tylenol is safe if you need to take something.   DIET RECOMMENDATIONS - Avoid spicy, greasy, fried foods, also things like caffeine, dark chocolate, peppermint can worsen - Avoid large meals and late night snacks, also do not go more than 4-5 hours without a snack or meal (not eating will worsen reflux symptoms due to stomach acid) - You may also elevate the head of your bed at night to sleep at very slight incline to help reduce symptoms   If the problem improves but keeps coming back, we can discuss higher dose or longer course at next visit.   If symptoms are worsening or persistent despite treatment or develop any different severe esophagus or abdominal pain, unable to swallow solids or liquids, nausea, vomiting especially blood in vomit, fever/chills, or unintentional weight loss / no appetite, please follow-up sooner in office or seek more immediate medical attention at hospital Emergency Department.  Regarding other medicines:   - STOP taking Ibuprofen, Advil, Motrin, Goody's / BC powder - DO NOT take without discussing with your doctor. These medicines can put you at high risk for future bleeding.   Please schedule a Follow-up Appointment to: Return in about 4 weeks (around 08/25/2022) for 4 weeks follow-up GERD vs  Costochondritis, BP recheck.  If you have any other questions or concerns, please feel free to call the office or send a message through [place]. You may also schedule an earlier appointment if necessary.  Additionally, you may be receiving a survey about your experience at our office within a few days to 1 week by e-mail or mail. We value your feedback.  Nobie Putnam, DO [facility]

## 2022-07-28 NOTE — Progress Notes (Signed)
Subjective:    Patient ID: Dean Morton, male    DOB: 1970/09/20, 52 y.o.   MRN: [id number]  Dean Morton is a 52 y.o. male presenting on 07/28/2022 for Establish Care  Here to establish with new PCP. Previously [facility]  HPI  He works at [employer] during Summer He will get seasonal allergies in Fall / Spring  Gastritis / L Abdominal Pain ED Follow-up  Recently 1 week ago he felt some chest and left sided soreness, thought stomach acid build up, and he was triaged sent to [facility] ED. He had some elevated BP. He was diagnosed with Gastritis due to NSAID is what was thought from. He has concern with his Right rotator cuff. He was taking Ibuprofen 800mg  3 times a day recently. - He was given Famotidine 20mg  daily and Omeprazole 10mg  daily He feels bulging on L side that is sore in this lower chest wall rib / abdominal region. Worse with provoked position change.  Lab review shows K 3.4 mild low, previous history low.  Low Back Pain Right Rotator Cuff Followed by [facility] for spine / back and also seen for R shoulder 12/22 started Ibuprofen 800mg  x 3 a day and Tylenol, for about 2 weeks then improved and came off med, then returned for Right shoulder rotator cuff impingement and restarted Ibuprofen and then several days later, resulted in gastritis symptoms see above They will consider injection or other therapy next.  Additional some elevated BP, without dx Hypertension.  Health Maintenance: He had COVID one time, and he is fully vaccinated.      10/03/2016    3:21 PM  Depression screen PHQ 2/9  Decreased Interest 0  Down, Depressed, Hopeless 0  PHQ - 2 Score 0    Past Medical History:  Diagnosis Date   Allergy    workplace [place] hissing roaches (10,000 +)   Anxiety    Hypertension    took BP meds with panic attacks   Pseudofolliculitis barbae    Past Surgical History:  Procedure Laterality Date   UMBILICAL HERNIA REPAIR     Social History    Socioeconomic History   Marital status: Married    Spouse name: Not on file   Number of children: Not on file   Years of education: Not on file   Highest education level: Not on file  Occupational History   Not on file  Tobacco Use   Smoking status: Never   Smokeless tobacco: Never  Substance and Sexual Activity   Alcohol use: No   Drug use: No   Sexual activity: Yes    Birth control/protection: None  Other Topics Concern   Not on file  Social History Narrative   Not on file   Social Determinants of Health   Financial Resource Strain: Not on file  Food Insecurity: Not on file  Transportation Needs: Not on file  Physical Activity: Not on file  Stress: Not on file  Social Connections: Not on file  Intimate Partner Violence: Not on file   Family History  Problem Relation Age of Onset   Hypertension Mother    Stroke Maternal Grandmother    Current Outpatient Medications on File Prior to Visit  Medication Sig   ePHEDrine-GuaiFENesin (BRONKAID PO) Take 1 tablet by mouth daily as needed.    fluticasone (FLONASE) 50 MCG/ACT nasal spray Place 2 sprays into both nostrils as needed.   No current facility-administered medications on file prior to visit.  Review of Systems Per HPI unless specifically indicated above      Objective:    BP 138/84 (BP Location: Left Arm, Cuff Size: Normal)   Pulse 77   Ht 6\' 1"  (1.854 m)   Wt 268 lb (121.6 kg)   SpO2 97%   BMI 35.36 kg/m   Wt Readings from Last 3 Encounters:  07/28/22 268 lb (121.6 kg)  07/21/22 283 lb (128.4 kg)  02/15/22 280 lb (127 kg)    Physical Exam Vitals and nursing note reviewed.  Constitutional:      General: He is not in acute distress.    Appearance: He is well-developed. He is obese. He is not diaphoretic.     Comments: Well-appearing, comfortable, cooperative  HENT:     Head: Normocephalic and atraumatic.  Eyes:     General:        Right eye: No discharge.        Left eye: No discharge.      Conjunctiva/sclera: Conjunctivae normal.  Neck:     Thyroid: No thyromegaly.  Cardiovascular:     Rate and Rhythm: Normal rate and regular rhythm.     Pulses: Normal pulses.     Heart sounds: Normal heart sounds. No murmur heard. Pulmonary:     Effort: Pulmonary effort is normal. No respiratory distress.     Breath sounds: Normal breath sounds. No wheezing or rales.  Chest:     Chest wall: Tenderness (reproduced tenderness lower left ribcage cartilage at upper abdomen junction.) present.  Musculoskeletal:        General: Normal range of motion.     Cervical back: Normal range of motion and neck supple.  Lymphadenopathy:     Cervical: No cervical adenopathy.  Skin:    General: Skin is warm and dry.     Findings: No erythema or rash.  Neurological:     Mental Status: He is alert and oriented to person, place, and time. Mental status is at baseline.  Psychiatric:        Behavior: Behavior normal.     Comments: Well groomed, good eye contact, normal speech and thoughts      I have personally reviewed the radiology report from 07/21/22 on CXR.   Study Result CLINICAL DATA: Left upper abdominal pain.  EXAM: CHEST - 2 VIEW  COMPARISON: Chest radiograph 09/10/2017  FINDINGS: The cardiomediastinal silhouette is normal.  There is no focal consolidation or pulmonary edema. There is no pleural effusion or pneumothorax  There is no displaced rib fracture or other acute osseous abnormality.  IMPRESSION: No radiographic evidence of acute cardiopulmonary process.   Electronically Signed By: Valetta Mole M.D. On: 07/21/2022 20:04  Results for orders placed or performed during the hospital encounter of 07/21/22  Lipase, blood  Result Value Ref Range   Lipase 29 11 - 51 U/L  CBC with Differential  Result Value Ref Range   WBC 3.9 (L) 4.0 - 10.5 K/uL   RBC 5.02 4.22 - 5.81 MIL/uL   Hemoglobin 14.6 13.0 - 17.0 g/dL   HCT 43.1 39.0 - 52.0 %   MCV 85.9 80.0 - 100.0 fL    MCH 29.1 26.0 - 34.0 pg   MCHC 33.9 30.0 - 36.0 g/dL   RDW 12.2 11.5 - 15.5 %   Platelets 188 150 - 400 K/uL   nRBC 0.0 0.0 - 0.2 %   Neutrophils Relative % 45 %   Neutro Abs 1.7 1.7 - 7.7 K/uL   Lymphocytes Relative 46 %  Lymphs Abs 1.8 0.7 - 4.0 K/uL   Monocytes Relative 6 %   Monocytes Absolute 0.3 0.1 - 1.0 K/uL   Eosinophils Relative 2 %   Eosinophils Absolute 0.1 0.0 - 0.5 K/uL   Basophils Relative 1 %   Basophils Absolute 0.0 0.0 - 0.1 K/uL   Immature Granulocytes 0 %   Abs Immature Granulocytes 0.01 0.00 - 0.07 K/uL  Comprehensive metabolic panel  Result Value Ref Range   Sodium 139 135 - 145 mmol/L   Potassium 3.4 (L) 3.5 - 5.1 mmol/L   Chloride 106 98 - 111 mmol/L   CO2 27 22 - 32 mmol/L   Glucose, Bld 97 70 - 99 mg/dL   BUN 12 6 - 20 mg/dL   Creatinine, Ser 1.76 0.61 - 1.24 mg/dL   Calcium 9.2 8.9 - 16.0 mg/dL   Total Protein 7.5 6.5 - 8.1 g/dL   Albumin 4.6 3.5 - 5.0 g/dL   AST 26 15 - 41 U/L   ALT 17 0 - 44 U/L   Alkaline Phosphatase 54 38 - 126 U/L   Total Bilirubin 0.7 0.3 - 1.2 mg/dL   GFR, Estimated >73 >71 mL/min   Anion gap 6 5 - 15      Assessment & Plan:   Problem List Items Addressed This Visit     Elevated blood pressure reading   Obesity (BMI 35.0-39.9 without comorbidity) - Primary   Other Visit Diagnoses     Encounter to establish care with new doctor       Acute gastritis without hemorrhage, unspecified gastritis type       Gastroesophageal reflux disease without esophagitis       Relevant Medications   omeprazole (PRILOSEC) 40 MG capsule   Costochondritis       Relevant Medications   baclofen (LIORESAL) 10 MG tablet       Establish care. Review of ED Visit 07/21/22 Work up lab testing, EKG, imaging reviewed. Suggested to be gastritis NSAID induced per ED  Today reviewed limited improvement but very short course of low dose PPI therapy so far. But history is not entirely suggestive of GERD / PUD, but this is still  possible.  Seems to be more reproducible and positional. Most likely is cartilage / rib contusion or musculoskeletal injury as discussed.  Possible stomach acid gastritis problem due to the Ibuprofen, cannot rule this out or discount this yet.  Will trial higher dose for short time period 4 weeks approx and see if beneficial  Discontinue Ibuprofen all NSAID  Stop the Omeprazole 10mg  and Famotidine 20mg  START new rx Omeprazole 40mg  daily before breakfast for 4 weeks, then taper down every other day, and on to the 10 mg dosage and then again phase down and OFF this over another 1-2 weeks. Avoid the rebound effect.  ---------------------------  For rib / cartilage  START anti inflammatory topical - OTC Voltaren (generic Diclofenac) topical 2-4 times a day as needed for pain swelling of affected joint for 1-2 weeks or longer.  Tylenol is safe if you need to take something.  Avoiding provoking the area. Monitor progress If still worsening or difficulty due to pain can notify me sooner or f/u 4 weeks will check CT imaging as next option.   Meds ordered this encounter  Medications   omeprazole (PRILOSEC) 40 MG capsule    Sig: Take 1 capsule (40 mg total) by mouth daily before breakfast.    Dispense:  30 capsule    Refill:  2   baclofen (LIORESAL) 10 MG tablet    Sig: Take 0.5-1 tablets (5-10 mg total) by mouth 3 (three) times daily as needed for muscle spasms.    Dispense:  30 each    Refill:  1      Follow up plan: Return in about 4 weeks (around 08/25/2022) for 4 weeks follow-up GERD vs Costochondritis, BP recheck.  Saralyn Pilar, DO [facility] 07/28/2022, 10:13 AM

## 2022-08-01 ENCOUNTER — Ambulatory Visit: Payer: Self-pay

## 2022-08-01 NOTE — Telephone Encounter (Signed)
I called him during lunch hour to answer his question and discuss the triage. Did not reach.  Called back around 450pm today and talked with him about his symptoms. He says he is much better and improved. He has taken the Omeprazole 40mg  daily without change or improvement. But he is much better, still has some provoked pain positional only much more suggestive of MSK etiology from our discussion. He will now taper down Omeprazole back to 10mg  and then taper and off over 2 weeks.  He may start Baclofen AS NEEDED has not atken this  Nobie Putnam, DO [facility] 08/01/2022, 5:08 PM

## 2022-08-01 NOTE — Telephone Encounter (Signed)
Chief Complaint: abdominal discomfort Symptoms: Same as when seen on 07/28/22 in office, not any better Frequency: N/A Pertinent Negatives: Patient denies new symptoms Disposition: [] ED /[] Urgent Care (no appt availability in office) / [] Appointment(In office/virtual)/ []  [facility]/ [] Home Care/ [] Refused Recommended Disposition /[] [facility]/ [x]  Follow-up with PCP Additional Notes: Patient was seen on 07/28/22 and was told to call back if he was no better. He says he's calling back and the discomfort has not changed with taking the Omeprazole 40 mg as prescribed. He says Dr. Raliegh Ip mentioned he may need a scan or something. Advised I will send this to Dr. Parks Ranger for review and recommendation, someone will call back to give that advice.   Reason for Disposition  [1] Caller has NON-URGENT question (includes prescribed medication questions) AND [2] triager unable to answer  Answer Assessment - Initial Assessment Questions 1. MAIN CONCERN OR SYMPTOM:  "What is your main concern right now?" "What question do you have?" "What's the main symptom you're worried about?" (e.g., breathing difficulty, cough, fever. pain)     Abdominal pain, omeprazole not working 2. BETTER-SAME-WORSE: "Are you getting better, staying the same, or getting worse compared to how you felt at your last visit to the doctor (most recent medical visit)?"     Same 4. VISIT DATE: "When were you seen?" (Date)     07/28/22 5. VISIT DOCTOR: "What is the name of the doctor taking care of you now?"     Dr. Parks Ranger 6. VISIT DIAGNOSIS:  "What was the main symptom or problem that you were seen for?" "Were you given a diagnosis?"      Abdominal pain was the reason 7. VISIT MEDICINES: "Did the doctor order any new medicines for you to use?" If Yes, ask: "Have you filled the prescription and started taking the medicine?"      Omeprazole 8. PAIN: "Is there any pain?" If Yes, ask: "How bad is it?"  (Scale 0-10; or  mild, moderate, severe)    - NONE (0): no pain    - MILD (1-3): doesn't interfere with normal activities     - MODERATE (4-7): interferes with normal activities or awakens from sleep     - SEVERE (8-10): excruciating pain, unable to do any normal activities     Mild-Moderate 9. OTHER SYMPTOMS: "Do you have any other symptoms?"       No  Protocols used: Recent Medical Visit for Illness Follow-up Call-A-AH

## 2022-08-27 ENCOUNTER — Encounter: Payer: Self-pay | Admitting: Family Medicine

## 2022-08-27 ENCOUNTER — Ambulatory Visit: Payer: BC Managed Care – PPO | Admitting: Family Medicine

## 2022-08-27 VITALS — BP 138/92 | HR 77 | Ht 73.0 in | Wt 275.0 lb

## 2022-08-27 DIAGNOSIS — R3912 Poor urinary stream: Secondary | ICD-10-CM

## 2022-08-27 DIAGNOSIS — N401 Enlarged prostate with lower urinary tract symptoms: Secondary | ICD-10-CM

## 2022-08-27 DIAGNOSIS — K219 Gastro-esophageal reflux disease without esophagitis: Secondary | ICD-10-CM

## 2022-08-27 DIAGNOSIS — I10 Essential (primary) hypertension: Secondary | ICD-10-CM | POA: Diagnosis not present

## 2022-08-27 DIAGNOSIS — R350 Frequency of micturition: Secondary | ICD-10-CM

## 2022-08-27 NOTE — Patient Instructions (Addendum)
Thank you for coming to the office today.  I do believe you may have Hypertension high blood pressure I would recommend improving diet lifestyle low sodium diet Limit caffeine as well Keep track of BP a few times a week, write down readings, goal < 135 / 85 If persistently elevated >135/85 or 140/90, then we would need to treat next time.   Lab for PSA prostate and Urine testing   -------------------------   Saw Palmetto 80-160 mg capsules twice per day for urinary symptoms. Try this herbal to see if your urinary flow and symptoms are improved  ------------------------  Treating constipation will help your bowels. Goal to keep on the fiber.  For Constipation (less frequent bowel movement that can be hard dry or involve straining).  Recommend trying OTC Miralax 17g = 1 capful in large glass water once daily for now, try several days to see if working, goal is soft stool or BM 1-2 times daily, if too loose then reduce dose or try every other day. If not effective may need to increase it to 2 doses at once in AM or may do 1 in morning and 1 in afternoon/evening  - This medicine is very safe and can be used often without any problem and will not make you dehydrated. It is good for use on AS NEEDED BASIS or even MAINTENANCE therapy for longer term for several days to weeks at a time to help regulate bowel movements  Other more natural remedies or preventative treatment: - Increase hydration with water - Increase fiber in diet (high fiber foods = vegetables, leafy greens, oats/grains) - May take OTC Fiber supplement (metamucil powder or pill/gummy) - May try OTC Probiotic   Please schedule a Follow-up Appointment to: Return in about 3 months (around 11/25/2022) for 3 month follow-up HTN, BPH.  If you have any other questions or concerns, please feel free to call the office or send a message through [place]. You may also schedule an earlier appointment if necessary.  Additionally, you  may be receiving a survey about your experience at our office within a few days to 1 week by e-mail or mail. We value your feedback.  Nobie Putnam, DO [facility]

## 2022-08-27 NOTE — Progress Notes (Signed)
Subjective:    Patient ID: Dean Morton, male    DOB: Oct 09, 1970, 52 y.o.   MRN: [id number]  Dean Morton is a 52 y.o. male presenting on 08/27/2022 for Gastroesophageal Reflux and Hypertension   HPI  GERD follow up Last visit with me 07/28/22, 1 month ago started on PPI higher dose Omeprazole 22m daily, discussed dietary modifications. He has had major improvement in chest discomfort symptoms see prior documentation Now resolved, tapered off PPI Omeprazole - Question if temporary relief from Voltaren topical Did not take Baclofen he has improved his diet significantly, avoiding fried greasy and acidic foods. Overall improved.   Low Back Pain Right Rotator Cuff Followed by [facility] for spine / back and also seen for R shoulder 12/22 started Ibuprofen 8068mx 3 a day and Tylenol, for about 2 weeks then improved and came off med, then returned for Right shoulder rotator cuff impingement and restarted Ibuprofen and then several days later, resulted in gastritis symptoms see above  CHRONIC HTN: Reports elevated BP previously chart review shows high readings 2018+ 140-150 / 90 avg Current Meds - never on med   Denies CP, dyspnea, HA, edema, dizziness / lightheadedness  BPH LUTS He has variable symptoms worsening now. Seems to have some urge for urination and some lower pelvic cramping at times. He will try to void and then have reduced urinary flow, episodic worse. Past history DRE unremarkable.  AUA BPH Symptom Score over past 1 month 1. Sensation of not emptying bladder post void - 0 2. Urinate less than 2 hour after finish last void - 3 3. Start/Stop several times during void - 2 4. Difficult to postpone urination - 0 5. Weak urinary stream - 3 6. Push or strain urination - 2 7. Nocturia - 1 times  Total Score: 11 (Moderate BPH symptoms)  Also associated constipation interfering with his urinary habits      08/27/2022    9:59 AM 10/03/2016    3:21 PM   Depression screen PHQ 2/9  Decreased Interest 0 0  Down, Depressed, Hopeless 0 0  PHQ - 2 Score 0 0  Altered sleeping 0   Tired, decreased energy 0   Change in appetite 0   Feeling bad or failure about yourself  0   Trouble concentrating 0   Moving slowly or fidgety/restless 0   Suicidal thoughts 0   PHQ-9 Score 0   Difficult doing work/chores Not difficult at all     Social History   Tobacco Use   Smoking status: Never   Smokeless tobacco: Never  Substance Use Topics   Alcohol use: No   Drug use: No    Review of Systems Per HPI unless specifically indicated above     Objective:    BP (!) 138/92 (BP Location: Left Arm, Cuff Size: Normal)   Pulse 77   Ht 6' 1"$  (1.854 m)   Wt 275 lb (124.7 kg)   SpO2 97%   BMI 36.28 kg/m   Wt Readings from Last 3 Encounters:  08/27/22 275 lb (124.7 kg)  07/28/22 268 lb (121.6 kg)  07/21/22 283 lb (128.4 kg)    Physical Exam Vitals and nursing note reviewed.  Constitutional:      General: He is not in acute distress.    Appearance: He is well-developed. He is not diaphoretic.     Comments: Well-appearing, comfortable, cooperative  HENT:     Head: Normocephalic and atraumatic.  Eyes:  General:        Right eye: No discharge.        Left eye: No discharge.     Conjunctiva/sclera: Conjunctivae normal.  Neck:     Thyroid: No thyromegaly.  Cardiovascular:     Rate and Rhythm: Normal rate and regular rhythm.     Pulses: Normal pulses.     Heart sounds: Normal heart sounds. No murmur heard. Pulmonary:     Effort: Pulmonary effort is normal. No respiratory distress.     Breath sounds: Normal breath sounds. No wheezing or rales.  Musculoskeletal:        General: Normal range of motion.     Cervical back: Normal range of motion and neck supple.  Lymphadenopathy:     Cervical: No cervical adenopathy.  Skin:    General: Skin is warm and dry.     Findings: No erythema or rash.  Neurological:     Mental Status: He is  alert and oriented to person, place, and time. Mental status is at baseline.  Psychiatric:        Behavior: Behavior normal.     Comments: Well groomed, good eye contact, normal speech and thoughts      Results for orders placed or performed during the hospital encounter of 07/21/22  Lipase, blood  Result Value Ref Range   Lipase 29 11 - 51 U/L  CBC with Differential  Result Value Ref Range   WBC 3.9 (L) 4.0 - 10.5 K/uL   RBC 5.02 4.22 - 5.81 MIL/uL   Hemoglobin 14.6 13.0 - 17.0 g/dL   HCT 43.1 39.0 - 52.0 %   MCV 85.9 80.0 - 100.0 fL   MCH 29.1 26.0 - 34.0 pg   MCHC 33.9 30.0 - 36.0 g/dL   RDW 12.2 11.5 - 15.5 %   Platelets 188 150 - 400 K/uL   nRBC 0.0 0.0 - 0.2 %   Neutrophils Relative % 45 %   Neutro Abs 1.7 1.7 - 7.7 K/uL   Lymphocytes Relative 46 %   Lymphs Abs 1.8 0.7 - 4.0 K/uL   Monocytes Relative 6 %   Monocytes Absolute 0.3 0.1 - 1.0 K/uL   Eosinophils Relative 2 %   Eosinophils Absolute 0.1 0.0 - 0.5 K/uL   Basophils Relative 1 %   Basophils Absolute 0.0 0.0 - 0.1 K/uL   Immature Granulocytes 0 %   Abs Immature Granulocytes 0.01 0.00 - 0.07 K/uL  Comprehensive metabolic panel  Result Value Ref Range   Sodium 139 135 - 145 mmol/L   Potassium 3.4 (L) 3.5 - 5.1 mmol/L   Chloride 106 98 - 111 mmol/L   CO2 27 22 - 32 mmol/L   Glucose, Bld 97 70 - 99 mg/dL   BUN 12 6 - 20 mg/dL   Creatinine, Ser 0.94 0.61 - 1.24 mg/dL   Calcium 9.2 8.9 - 10.3 mg/dL   Total Protein 7.5 6.5 - 8.1 g/dL   Albumin 4.6 3.5 - 5.0 g/dL   AST 26 15 - 41 U/L   ALT 17 0 - 44 U/L   Alkaline Phosphatase 54 38 - 126 U/L   Total Bilirubin 0.7 0.3 - 1.2 mg/dL   GFR, Estimated >60 >60 mL/min   Anion gap 6 5 - 15      Assessment & Plan:   Problem List Items Addressed This Visit     Benign prostatic hyperplasia with weak urinary stream - Primary   Relevant Orders   Urinalysis, Routine w  reflex microscopic   Urine Culture   PSA   Essential hypertension   Gastroesophageal reflux  disease without esophagitis   Other Visit Diagnoses     Urinary frequency       Relevant Orders   Urinalysis, Routine w reflex microscopic   Urine Culture       GERD Resolved on course of PPI, now remains off Omeprazole 44m daily Improved lifestyle diet management  HYPERTENSION, new diagnosis but seems to be chronic problem. I would recommend improving diet lifestyle low sodium diet Limit caffeine as well Keep track of BP a few times a week, write down readings, goal < 135 / 85 If persistently elevated >135/85 or 140/90, then we would need to treat next time with rx management  BPH LUTS History highly suggestive of BPH LUTS with mixed urinary symptoms, age 89male No recent PSA screening, due for lab. Last 2019  Check UA / Urine culture / PSA lab  Saw Palmetto 80-160 mg capsules twice per day for urinary symptoms. Try this herbal to see if your urinary flow and symptoms are improved Future consider medication option if still needed  Orders Placed This Encounter  Procedures   Urine Culture   Urinalysis, Routine w reflex microscopic   PSA     No orders of the defined types were placed in this encounter.    Follow up plan: Return in about 3 months (around 11/25/2022) for 3 month follow-up HTN, BPH.   ANobie Putnam DO [facility] 08/27/2022, 10:24 AM

## 2022-08-28 LAB — URINALYSIS, ROUTINE W REFLEX MICROSCOPIC
Bilirubin Urine: NEGATIVE
Glucose, UA: NEGATIVE
Hgb urine dipstick: NEGATIVE
Ketones, ur: NEGATIVE
Leukocytes,Ua: NEGATIVE
Nitrite: NEGATIVE
Protein, ur: NEGATIVE
Specific Gravity, Urine: 1.02 (ref 1.001–1.035)
pH: <=5 (ref 5.0–8.0)

## 2022-08-28 LAB — URINE CULTURE
MICRO NUMBER:: 14595752
Result:: NO GROWTH
SPECIMEN QUALITY:: ADEQUATE

## 2022-08-28 LAB — PSA: PSA: 0.4 ng/mL (ref <=4.00)

## 2022-09-08 ENCOUNTER — Ambulatory Visit: Payer: BLUE CROSS/BLUE SHIELD | Admitting: Family Medicine

## 2022-09-24 DIAGNOSIS — L603 Nail dystrophy: Secondary | ICD-10-CM | POA: Diagnosis not present

## 2022-09-24 DIAGNOSIS — L600 Ingrowing nail: Secondary | ICD-10-CM | POA: Diagnosis not present

## 2022-09-24 DIAGNOSIS — M79675 Pain in left toe(s): Secondary | ICD-10-CM | POA: Diagnosis not present

## 2022-11-25 ENCOUNTER — Encounter: Payer: Self-pay | Admitting: Family Medicine

## 2022-11-25 ENCOUNTER — Ambulatory Visit (INDEPENDENT_AMBULATORY_CARE_PROVIDER_SITE_OTHER): Payer: BC Managed Care – PPO | Admitting: Family Medicine

## 2022-11-25 VITALS — BP 138/90 | HR 79 | Ht 73.0 in | Wt 277.0 lb

## 2022-11-25 DIAGNOSIS — E669 Obesity, unspecified: Secondary | ICD-10-CM | POA: Diagnosis not present

## 2022-11-25 DIAGNOSIS — R3912 Poor urinary stream: Secondary | ICD-10-CM

## 2022-11-25 DIAGNOSIS — K219 Gastro-esophageal reflux disease without esophagitis: Secondary | ICD-10-CM | POA: Diagnosis not present

## 2022-11-25 DIAGNOSIS — I10 Essential (primary) hypertension: Secondary | ICD-10-CM | POA: Diagnosis not present

## 2022-11-25 DIAGNOSIS — I739 Peripheral vascular disease, unspecified: Secondary | ICD-10-CM

## 2022-11-25 DIAGNOSIS — N401 Enlarged prostate with lower urinary tract symptoms: Secondary | ICD-10-CM | POA: Diagnosis not present

## 2022-11-25 NOTE — Patient Instructions (Addendum)
Thank you for coming to the office today.  I recommend starting BP medication to help control your BP longer term for maintenance  1st option = Amlodipine 5mg  daily for blood pressure, calcium channel blocker daily in morning. Keep same each day. Potential side effect with increased fluid retention lower extremity foot ankle leg. This is not harmful but can be present. It should improve over time.  2nd option = Valsartan 80 mg daily for blood pressure, works through kidney, can raise potassium, can impact kidney function temporarily, we need to monitor blood closer.  Armpits look normal, the R is slightly bigger but it feels fine, I do not see or feel any cyst or infection concerns.  Keep on [employer] if it helps prostate use as needed  Keep working on lifestyle and we can follow-up in 6 months   DUE for FASTING BLOOD WORK (no food or drink after midnight before the lab appointment, only water or coffee without cream/sugar on the morning of)  SCHEDULE "Lab Only" visit in the morning at the clinic for lab draw in 6 MONTHS   - Make sure Lab Only appointment is at about 1 week before your next appointment, so that results will be available  For Lab Results, once available within 2-3 days of blood draw, you can can log in to MyChart online to view your results and a brief explanation. Also, we can discuss results at next follow-up visit.    Please schedule a Follow-up Appointment to: Return in about 6 months (around 05/28/2023) for 6 month fasting lab only then 1 week later Annual Physical.  If you have any other questions or concerns, please feel free to call the office or send a message through MyChart. You may also schedule an earlier appointment if necessary.  Additionally, you may be receiving a survey about your experience at our office within a few days to 1 week by e-mail or mail. We value your feedback.  Saralyn Pilar, DO [facility], [place]

## 2022-11-25 NOTE — Progress Notes (Unsigned)
Subjective:    Patient ID: Dean Morton, male    DOB: 1971/01/19, 52 y.o.   MRN: [id number]  Dean Morton is a 52 y.o. male presenting on 11/25/2022 for Hypertension, Benign Prostatic Hypertrophy, and Hand Pain (Left hand pain started 4 days ago)   HPI  GERD follow up Resolved Off PPI Omeprazole he has improved his diet significantly, avoiding fried greasy and acidic foods. Overall improved.   Low Back Pain Right Rotator Cuff Followed by [facility] for spine / back and also seen for R shoulder 12/22 started Ibuprofen 800mg  x 3 a day and Tylenol, for about 2 weeks then improved and came off med, then returned for Right shoulder rotator cuff impingement and restarted Ibuprofen and then several days later, resulted in gastritis symptoms see above  Costochondritis - resolved  R>L Axillary edema Incidental finding. Non tender. No nodular density Fam history   CHRONIC HTN: Reports elevated BP previously chart review shows high readings 2018+ 140-150 / 90 avg Home BP readings 160-170s / 100s, then it improved.  Here BP readings done with some variance Prior history treated BP before at [facility]. He was having issues with anxiety/mood. Prior was on Hydrochlorothiazide previously, had tapered down dose. Current Meds - never on med Diet - limits salt intake Denies CP, dyspnea, HA, edema, dizziness / lightheadedness  Obesity BMI >36 Goal to improve exercise and stay more active    BPH LUTS He has variable symptoms worsening now. Seems to have some urge for urination and some lower pelvic cramping at times. He will try to void and then have reduced urinary flow, episodic worse. Past history DRE unremarkable.   AUA BPH Symptom Score over past 1 month 1. Sensation of not emptying bladder post void - 0 2. Urinate less than 2 hour after finish last void - 3 > 1 3. Start/Stop several times during void - 2 > 1 4. Difficult to postpone urination - 0 5. Weak urinary stream - 3 >  1 6. Push or strain urination - 2  > 1 7. Nocturia - 1 times   Total Score: 11 > 5 (Mild BPH symptoms)         11/25/2022   10:28 AM 08/27/2022    9:59 AM 10/03/2016    3:21 PM  Depression screen PHQ 2/9  Decreased Interest 0 0 0  Down, Depressed, Hopeless 0 0 0  PHQ - 2 Score 0 0 0  Altered sleeping  0   Tired, decreased energy  0   Change in appetite  0   Feeling bad or failure about yourself   0   Trouble concentrating  0   Moving slowly or fidgety/restless  0   Suicidal thoughts  0   PHQ-9 Score  0   Difficult doing work/chores  Not difficult at all       11/25/2022   10:28 AM 08/27/2022    9:59 AM  GAD 7 : Generalized Anxiety Score  Nervous, Anxious, on Edge 0 0  Control/stop worrying 0 0  Worry too much - different things 0 0  Trouble relaxing 0 0  Restless 0 0  Easily annoyed or irritable 0 0  Afraid - awful might happen 0   Total GAD 7 Score 0   Anxiety Difficulty  Not difficult at all      Social History   Tobacco Use   Smoking status: Never   Smokeless tobacco: Never  Substance Use Topics   Alcohol use:  No   Drug use: No    Review of Systems Per HPI unless specifically indicated above     Objective:    BP (!) 138/90 (BP Location: Left Arm, Cuff Size: Large)   Pulse 79   Ht 6\' 1"  (1.854 m)   Wt 277 lb (125.6 kg)   SpO2 98%   BMI 36.55 kg/m   Wt Readings from Last 3 Encounters:  11/25/22 277 lb (125.6 kg)  08/27/22 275 lb (124.7 kg)  07/28/22 268 lb (121.6 kg)    Physical Exam Vitals and nursing note reviewed.  Constitutional:      General: He is not in acute distress.    Appearance: He is well-developed. He is not diaphoretic.     Comments: Well-appearing, comfortable, cooperative  HENT:     Head: Normocephalic and atraumatic.  Eyes:     General:        Right eye: No discharge.        Left eye: No discharge.     Conjunctiva/sclera: Conjunctivae normal.  Neck:     Thyroid: No thyromegaly.  Cardiovascular:     Rate and  Rhythm: Normal rate and regular rhythm.     Pulses: Normal pulses.     Heart sounds: Normal heart sounds. No murmur heard. Pulmonary:     Effort: Pulmonary effort is normal. No respiratory distress.     Breath sounds: Normal breath sounds. No wheezing or rales.  Musculoskeletal:        General: Normal range of motion.     Cervical back: Normal range of motion and neck supple.     Right lower leg: No edema.     Left lower leg: No edema.     Comments: Fairly symmetrical excess skin within axillary region R>L some bulging no tenderness no erythema no nodular density  Lymphadenopathy:     Cervical: No cervical adenopathy.  Skin:    General: Skin is warm and dry.     Findings: No erythema or rash.  Neurological:     Mental Status: He is alert and oriented to person, place, and time. Mental status is at baseline.  Psychiatric:        Behavior: Behavior normal.     Comments: Well groomed, good eye contact, normal speech and thoughts       Results for orders placed or performed in visit on 08/27/22  Urine Culture   Specimen: Urine  Result Value Ref Range   MICRO NUMBER: [id number]    SPECIMEN QUALITY: Adequate    Sample Source URINE, CLEAN CATCH    STATUS: FINAL    Result: No Growth   Urinalysis, Routine w reflex microscopic  Result Value Ref Range   Color, Urine YELLOW YELLOW   APPearance CLEAR CLEAR   Specific Gravity, Urine 1.020 1.001 - 1.035   pH < OR = 5.0 5.0 - 8.0   Glucose, UA NEGATIVE NEGATIVE   Bilirubin Urine NEGATIVE NEGATIVE   Ketones, ur NEGATIVE NEGATIVE   Hgb urine dipstick NEGATIVE NEGATIVE   Protein, ur NEGATIVE NEGATIVE   Nitrite NEGATIVE NEGATIVE   Leukocytes,Ua NEGATIVE NEGATIVE  PSA  Result Value Ref Range   PSA 0.40 < OR = 4.00 ng/mL      Assessment & Plan:   Problem List Items Addressed This Visit     Benign prostatic hyperplasia with weak urinary stream   Essential hypertension - Primary    Still elevated BP on repeat readings Home results  reviewed, question calibration of BP  cuff at home No known complications  Past history tried Thiazide years ago   Plan:  1. He prefers to avoid medication still, we discussed options - 1st Amlodipine 5mg  daily and 2nd Valsartan 80mg  daily - Reconsider at next appointment if persistent elevated BP would be indicated as recommend can start now but he declines.  Encourage improved lifestyle - low sodium diet, regular exercise Start continue monitor BP outside office, bring readings to next visit, if persistently >140/90 or new symptoms notify office sooner F/u 6 months      Gastroesophageal reflux disease without esophagitis   Obesity (BMI 35.0-39.9 without comorbidity)   Vasospasm of peripheral artery (HCC)     Armpits look normal, the R is slightly bigger but it feels fine, I do not see or feel any cyst or infection concerns.  Keep on [facility] if it helps prostate use as needed   No orders of the defined types were placed in this encounter.    Follow up plan: Return in about 6 months (around 05/28/2023) for 6 month fasting lab only then 1 week later Annual Physical.  Future labs ordered for 05/28/23    Saralyn Pilar, DO [facility] 11/25/2022, 10:24 AM

## 2022-11-25 NOTE — Progress Notes (Incomplete)
Subjective:    Patient ID: Dean Morton, male    DOB: 1971/05/05, 52 y.o.   MRN: [id number]  Dean Morton is a 52 y.o. male presenting on 11/25/2022 for Hypertension, Benign Prostatic Hypertrophy, and Hand Pain (Left hand pain started 4 days ago)   HPI  GERD follow up Resolved Off PPI Omeprazole he has improved his diet significantly, avoiding fried greasy and acidic foods. Overall improved.   Low Back Pain Right Rotator Cuff Followed by [facility] for spine / back and also seen for R shoulder 12/22 started Ibuprofen 800mg  x 3 a day and Tylenol, for about 2 weeks then improved and came off med, then returned for Right shoulder rotator cuff impingement and restarted Ibuprofen and then several days later, resulted in gastritis symptoms see above  Costochondritis - resolved  R>L Axillary edema Incidental finding. Non tender. No nodular density Fam history   CHRONIC HTN: Reports elevated BP previously chart review shows high readings 2018+ 140-150 / 90 avg Home BP readings 160-170s / 100s, then it improved.  Here BP readings done with some variance Prior history treated BP before at [facility]. He was having issues with anxiety/mood. Prior was on Hydrochlorothiazide previously, had tapered down dose. Current Meds - never on med Diet - limits salt intake Denies CP, dyspnea, HA, edema, dizziness / lightheadedness  Obesity BMI >36 Goal to improve exercise and stay more active    BPH LUTS He has variable symptoms worsening now. Seems to have some urge for urination and some lower pelvic cramping at times. He will try to void and then have reduced urinary flow, episodic worse. Past history DRE unremarkable.   AUA BPH Symptom Score over past 1 month 1. Sensation of not emptying bladder post void - 0 2. Urinate less than 2 hour after finish last void - 3 > 1 3. Start/Stop several times during void - 2 > 1 4. Difficult to postpone urination - 0 5. Weak urinary stream - 3 >  1 6. Push or strain urination - 2  > 1 7. Nocturia - 1 times   Total Score: 11 > 5 (Mild BPH symptoms)         11/25/2022   10:28 AM 08/27/2022    9:59 AM 10/03/2016    3:21 PM  Depression screen PHQ 2/9  Decreased Interest 0 0 0  Down, Depressed, Hopeless 0 0 0  PHQ - 2 Score 0 0 0  Altered sleeping  0   Tired, decreased energy  0   Change in appetite  0   Feeling bad or failure about yourself   0   Trouble concentrating  0   Moving slowly or fidgety/restless  0   Suicidal thoughts  0   PHQ-9 Score  0   Difficult doing work/chores  Not difficult at all       11/25/2022   10:28 AM 08/27/2022    9:59 AM  GAD 7 : Generalized Anxiety Score  Nervous, Anxious, on Edge 0 0  Control/stop worrying 0 0  Worry too much - different things 0 0  Trouble relaxing 0 0  Restless 0 0  Easily annoyed or irritable 0 0  Afraid - awful might happen 0   Total GAD 7 Score 0   Anxiety Difficulty  Not difficult at all      Social History   Tobacco Use  . Smoking status: Never  . Smokeless tobacco: Never  Substance Use Topics  . Alcohol use:  No  . Drug use: No    Review of Systems Per HPI unless specifically indicated above     Objective:    BP (!) 138/90 (BP Location: Left Arm, Cuff Size: Large)   Pulse 79   Ht 6\' 1"  (1.854 m)   Wt 277 lb (125.6 kg)   SpO2 98%   BMI 36.55 kg/m   Wt Readings from Last 3 Encounters:  11/25/22 277 lb (125.6 kg)  08/27/22 275 lb (124.7 kg)  07/28/22 268 lb (121.6 kg)    Physical Exam Vitals and nursing note reviewed.  Constitutional:      General: He is not in acute distress.    Appearance: He is well-developed. He is not diaphoretic.     Comments: Well-appearing, comfortable, cooperative  HENT:     Head: Normocephalic and atraumatic.  Eyes:     General:        Right eye: No discharge.        Left eye: No discharge.     Conjunctiva/sclera: Conjunctivae normal.  Neck:     Thyroid: No thyromegaly.  Cardiovascular:     Rate and  Rhythm: Normal rate and regular rhythm.     Pulses: Normal pulses.     Heart sounds: Normal heart sounds. No murmur heard. Pulmonary:     Effort: Pulmonary effort is normal. No respiratory distress.     Breath sounds: Normal breath sounds. No wheezing or rales.  Musculoskeletal:        General: Normal range of motion.     Cervical back: Normal range of motion and neck supple.     Right lower leg: No edema.     Left lower leg: No edema.     Comments: Fairly symmetrical excess skin within axillary region R>L some bulging no tenderness no erythema no nodular density  Lymphadenopathy:     Cervical: No cervical adenopathy.  Skin:    General: Skin is warm and dry.     Findings: No erythema or rash.  Neurological:     Mental Status: He is alert and oriented to person, place, and time. Mental status is at baseline.  Psychiatric:        Behavior: Behavior normal.     Comments: Well groomed, good eye contact, normal speech and thoughts       Results for orders placed or performed in visit on 08/27/22  Urine Culture   Specimen: Urine  Result Value Ref Range   MICRO NUMBER: [id number]    SPECIMEN QUALITY: Adequate    Sample Source URINE, CLEAN CATCH    STATUS: FINAL    Result: No Growth   Urinalysis, Routine w reflex microscopic  Result Value Ref Range   Color, Urine YELLOW YELLOW   APPearance CLEAR CLEAR   Specific Gravity, Urine 1.020 1.001 - 1.035   pH < OR = 5.0 5.0 - 8.0   Glucose, UA NEGATIVE NEGATIVE   Bilirubin Urine NEGATIVE NEGATIVE   Ketones, ur NEGATIVE NEGATIVE   Hgb urine dipstick NEGATIVE NEGATIVE   Protein, ur NEGATIVE NEGATIVE   Nitrite NEGATIVE NEGATIVE   Leukocytes,Ua NEGATIVE NEGATIVE  PSA  Result Value Ref Range   PSA 0.40 < OR = 4.00 ng/mL      Assessment & Plan:   Problem List Items Addressed This Visit     Benign prostatic hyperplasia with weak urinary stream   Essential hypertension - Primary   Gastroesophageal reflux disease without esophagitis    Obesity (BMI 35.0-39.9 without comorbidity)  Vasospasm of peripheral artery (HCC)     Armpits look normal, the R is slightly bigger but it feels fine, I do not see or feel any cyst or infection concerns.  Keep on [facility] if it helps prostate use as needed   No orders of the defined types were placed in this encounter.    Follow up plan: Return in about 6 months (around 05/28/2023) for 6 month fasting lab only then 1 week later Annual Physical.  Future labs ordered for ***    Saralyn Pilar, DO [facility] 11/25/2022, 10:24 AM

## 2022-11-26 ENCOUNTER — Other Ambulatory Visit: Payer: Self-pay | Admitting: Family Medicine

## 2022-11-26 DIAGNOSIS — I10 Essential (primary) hypertension: Secondary | ICD-10-CM

## 2022-11-26 DIAGNOSIS — E669 Obesity, unspecified: Secondary | ICD-10-CM

## 2022-11-26 DIAGNOSIS — N401 Enlarged prostate with lower urinary tract symptoms: Secondary | ICD-10-CM

## 2022-11-26 DIAGNOSIS — Z0000 Encounter for general adult medical examination without abnormal findings: Secondary | ICD-10-CM

## 2022-11-26 NOTE — Assessment & Plan Note (Signed)
Still elevated BP on repeat readings Home results reviewed, question calibration of BP cuff at home No known complications  Past history tried Thiazide years ago   Plan:  1. He prefers to avoid medication still, we discussed options - 1st Amlodipine 5mg  daily and 2nd Valsartan 80mg  daily - Reconsider at next appointment if persistent elevated BP would be indicated as recommend can start now but he declines.  Encourage improved lifestyle - low sodium diet, regular exercise Start continue monitor BP outside office, bring readings to next visit, if persistently >140/90 or new symptoms notify office sooner F/u 6 months

## 2023-03-19 ENCOUNTER — Ambulatory Visit: Payer: Self-pay

## 2023-03-19 NOTE — Telephone Encounter (Signed)
Pt advised.   Thanks,   -Laura  

## 2023-03-19 NOTE — Telephone Encounter (Signed)
Chief Complaint: stomach cramping  Symptoms: burping and gas, diarrhea Frequency: last week Pertinent Negatives: Patient denies chest pain, left arm pain , SOB nausea sweating.  Disposition: [] ED /[] Urgent Care (no appt availability in office) / [x] Appointment(In office/virtual)/ []  [facility]/ [] Home Care/ [] Refused Recommended Disposition /[] [facility]/ [x]  Follow-up with PCP Additional Notes: stated that he was on Omeprazole and Pepcid until first of the year. Pt scheduled appt with Pamala Hurry NP because it was the earliest. Pt placed on wait list. Pt wanting to know until appt what does he need to do or take OTC other than Mylanta Reason for Disposition  [1] MILD pain (e.g., does not interfere with normal activities) AND [2] pain comes and goes (cramps) [3] present > 48 hours  (Exception: This same abdominal pain is a chronic symptom recurrent or ongoing AND present > 4 weeks.)  Answer Assessment - Initial Assessment Questions 1. LOCATION: "Where does it hurt?"      Epigastric and left back radiation 2. RADIATION: "Does the pain shoot anywhere else?" (e.g., chest, back)     Back  3. ONSET: "When did the pain begin?" (Minutes, hours or days ago)      Last week  4. SUDDEN: "Gradual or sudden onset?"     sudden 5. PATTERN "Does the pain come and go, or is it constant?"    - If it comes and goes: "How long does it last?" "Do you have pain now?"     (Note: Comes and goes means the pain is intermittent. It goes away completely between bouts.)    - If constant: "Is it getting better, staying the same, or getting worse?"      (Note: Constant means the pain never goes away completely; most serious pain is constant and gets worse.)      Comes and goes 6. SEVERITY: "How bad is the pain?"  (e.g., Scale 1-10; mild, moderate, or severe)    - MILD (1-3): Doesn't interfere with normal activities, abdomen soft and not tender to touch.     - MODERATE (4-7): Interferes with  normal activities or awakens from sleep, abdomen tender to touch.     - SEVERE (8-10): Excruciating pain, doubled over, unable to do any normal activities.       3-4 7. RECURRENT SYMPTOM: "Have you ever had this type of stomach pain before?" If Yes, ask: "When was the last time?" and "What happened that time?"      yes 8. CAUSE: "What do you think is causing the stomach pain?"     GERD 9. RELIEVING/AGGRAVATING FACTORS: "What makes it better or worse?" (e.g., antacids, bending or twisting motion, bowel movement)     stress 10. OTHER SYMPTOMS: "Do you have any other symptoms?" (e.g., back pain, diarrhea, fever, urination pain, vomiting)       Cramping gas bloated  Answer Assessment - Initial Assessment Questions 1. LOCATION: "Where does it hurt?"       Elbow rib bone stomach area  2. RADIATION: "Does the pain go anywhere else?" (e.g., into neck, jaw, arms, back)     Back sharp pain on Sat went to left asside of back came and went  3. ONSET: "When did the chest pain begin?" (Minutes, hours or days)      Sat evening 4. PATTERN: "Does the pain come and go, or has it been constant since it started?"  "Does it get worse with exertion?"      Comes and goes 5.  DURATION: "How long does it last" (e.g., seconds, minutes, hours)     *No Answer* 6. SEVERITY: "How bad is the pain?"  (e.g., Scale 1-10; mild, moderate, or severe)    - MILD (1-3): doesn't interfere with normal activities     - MODERATE (4-7): interferes with normal activities or awakens from sleep    - SEVERE (8-10): excruciating pain, unable to do any normal activities       Mild 3-4  cramping pain  7. CARDIAC RISK FACTORS: "Do you have any history of heart problems or risk factors for heart disease?" (e.g., angina, prior heart attack; diabetes, high blood pressure, high cholesterol, smoker, or strong family history of heart disease)     *No Answer*  9. CAUSE: "What do you think is causing the chest pain?"     *No Answer* 10. OTHER  SYMPTOMS: "Do you have any other symptoms?" (e.g., dizziness, nausea, vomiting, sweating, fever, difficulty breathing, cough)       Burping, Increased flatus, stomach cramping, loose stools and belching, burning to stomach area. No acid taste feels bloated Gas x and Mylanta used occasionally helps  11. PREGNANCY: "Is there any chance you are pregnant?" "When was your last menstrual period?"       N/a  Protocols used: Chest Pain-A-AH, Abdominal Pain - Male-A-AH

## 2023-03-19 NOTE — Telephone Encounter (Signed)
For GI related symptoms  For OTC advice before he arrives to upcoming apt, I would suggest 2 week trial back on Omeprazole (Prilosec OTC) he can pick up 20mg  OTC and start this before his appt. If it was successful previously but came off of it earlier this year, he may want to restart that option. Also Pepcid was mentioned. This can be added in evenings in addition to Prilosec if preferred.  Pepto, Mylanta, Gas X are all temporary relief but may not solve the problem.  Saralyn Pilar, DO [facility] 03/19/2023, 4:06 PM

## 2023-03-30 ENCOUNTER — Ambulatory Visit: Payer: BC Managed Care – PPO | Admitting: Internal Medicine

## 2023-03-30 ENCOUNTER — Encounter: Payer: Self-pay | Admitting: Internal Medicine

## 2023-03-30 VITALS — BP 136/84 | HR 76 | Wt 282.0 lb

## 2023-03-30 DIAGNOSIS — R140 Abdominal distension (gaseous): Secondary | ICD-10-CM | POA: Diagnosis not present

## 2023-03-30 DIAGNOSIS — R1013 Epigastric pain: Secondary | ICD-10-CM

## 2023-03-30 DIAGNOSIS — K219 Gastro-esophageal reflux disease without esophagitis: Secondary | ICD-10-CM | POA: Diagnosis not present

## 2023-03-30 NOTE — Patient Instructions (Signed)

## 2023-03-30 NOTE — Progress Notes (Signed)
Subjective:    Patient ID: Dean Morton, male    DOB: [birth year], 52 y.o.   MRN: [id number]  HPI  Patient presents to clinic today with complaint of intermittent abdominal pain, bloating, reflux and diarrhea.  He noticed this about 2 weeks ago. He reports these symptoms were worse after eating. He is lactose intolerance. He reports the diarrhea has resolved. He denies nausea, vomiting, constipation or blood in his stool. He has tried omeprazole OTC  and drinking goat milk with some relief of symptoms. He does not take NSAID's OTC. There is a colonoscopy in care everywhere from 2008.   Review of Systems     Past Medical History:  Diagnosis Date   Allergy    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,000 +)   Anxiety    Hypertension    took BP meds with panic attacks   Pseudofolliculitis barbae     No current outpatient medications on file.   No current facility-administered medications for this visit.    Allergies  Allergen Reactions   Amoxicillin-Pot Clavulanate Nausea Only    Family History  Problem Relation Age of Onset   Hypertension Mother    Stroke Maternal Grandmother     Social History   Socioeconomic History   Marital status: Married    Spouse name: Not on file   Number of children: Not on file   Years of education: Not on file   Highest education level: Not on file  Occupational History   Not on file  Tobacco Use   Smoking status: Never   Smokeless tobacco: Never  Substance and Sexual Activity   Alcohol use: No   Drug use: No   Sexual activity: Yes    Birth control/protection: None  Other Topics Concern   Not on file  Social History Narrative   Not on file   Social Determinants of Health   Financial Resource Strain: Not on file  Food Insecurity: Not on file  Transportation Needs: Not on file  Physical Activity: Not on file  Stress: Not on file  Social Connections: Not on file  Intimate Partner Violence: Not on file      Constitutional: Denies fever, malaise, fatigue, headache or abrupt weight changes.  HEENT: Denies eye pain, eye redness, ear pain, ringing in the ears, wax buildup, runny nose, nasal congestion, bloody nose, or sore throat. Respiratory: Denies difficulty breathing, shortness of breath, cough or sputum production.   Cardiovascular: Denies chest pain, chest tightness, palpitations or swelling in the hands or feet.  Gastrointestinal: Patient reports abdominal pain, blaoting and diarrhea.  Denies constipation, or blood in the stool.   No other specific complaints in a complete review of systems (except as listed in HPI above).  Objective:   Physical Exam   BP 136/84 (BP Location: Left Arm, Patient Position: Sitting, Cuff Size: Large)   Pulse 76   Wt 282 lb (127.9 kg)   SpO2 99%   BMI 37.21 kg/m   Wt Readings from Last 3 Encounters:  11/25/22 277 lb (125.6 kg)  08/27/22 275 lb (124.7 kg)  07/28/22 268 lb (121.6 kg)    General: Appears his stated age, obese, in NAD. Cardiovascular: Normal rate and rhythm. S1,S2 noted.  No murmur, rubs or gallops noted. No JVD or BLE edema.  Pulmonary/Chest: Normal effort and positive vesicular breath sounds. No respiratory distress. No wheezes, rales or ronchi noted.  Abdomen: Soft and nontender. Normal bowel sounds. No distention or masses noted.  Musculoskeletal: No difficulty  with gait.  Neurological: Alert and oriented.   BMET    Component Value Date/Time   NA 139 07/21/2022 1935   NA 142 07/17/2012 0135   K 3.4 (L) 07/21/2022 1935   K 3.6 07/17/2012 0135   CL 106 07/21/2022 1935   CL 108 (H) 07/17/2012 0135   CO2 27 07/21/2022 1935   CO2 27 07/17/2012 0135   GLUCOSE 97 07/21/2022 1935   GLUCOSE 85 07/17/2012 0135   BUN 12 07/21/2022 1935   BUN 11 07/17/2012 0135   CREATININE 0.94 07/21/2022 1935   CREATININE 1.10 07/17/2012 0135   CALCIUM 9.2 07/21/2022 1935   CALCIUM 8.6 07/17/2012 0135   GFRNONAA >60 07/21/2022 1935    GFRNONAA >60 07/17/2012 0135   GFRAA >60 09/10/2017 1319   GFRAA >60 07/17/2012 0135    Lipid Panel  No results found for: "CHOL", "TRIG", "HDL", "CHOLHDL", "VLDL", "LDLCALC"  CBC    Component Value Date/Time   WBC 3.9 (L) 07/21/2022 1935   RBC 5.02 07/21/2022 1935   HGB 14.6 07/21/2022 1935   HGB 14.3 07/17/2012 0135   HCT 43.1 07/21/2022 1935   HCT 41.8 07/17/2012 0135   PLT 188 07/21/2022 1935   PLT 182 07/17/2012 0135   MCV 85.9 07/21/2022 1935   MCV 87 07/17/2012 0135   MCH 29.1 07/21/2022 1935   MCHC 33.9 07/21/2022 1935   RDW 12.2 07/21/2022 1935   RDW 13.2 07/17/2012 0135   LYMPHSABS 1.8 07/21/2022 1935   MONOABS 0.3 07/21/2022 1935   EOSABS 0.1 07/21/2022 1935   BASOSABS 0.0 07/21/2022 1935    Hgb A1C No results found for: "HGBA1C"         Assessment & Plan:   Epigastric pain, abdominal bloating, reflux:  Symptoms improved on omeprazole.  Advised him to finish out his 2-week course and stop medication.  If symptoms recur okay to restart omeprazole 20 mg daily Encouraged him to try to identify and avoid foods that trigger his reflux Encourage weight loss as this can help reduce reflux symptoms If symptoms recur, could check H. pylori stool culture for further evaluation  Follow-up with your PCP as previously scheduled Nicki Reaper, NP

## 2023-04-10 ENCOUNTER — Other Ambulatory Visit: Payer: Self-pay | Admitting: Family Medicine

## 2023-04-10 DIAGNOSIS — Z1212 Encounter for screening for malignant neoplasm of rectum: Secondary | ICD-10-CM

## 2023-04-10 DIAGNOSIS — Z1211 Encounter for screening for malignant neoplasm of colon: Secondary | ICD-10-CM

## 2023-04-17 DIAGNOSIS — Z1211 Encounter for screening for malignant neoplasm of colon: Secondary | ICD-10-CM | POA: Diagnosis not present

## 2023-04-17 DIAGNOSIS — Z1212 Encounter for screening for malignant neoplasm of rectum: Secondary | ICD-10-CM | POA: Diagnosis not present

## 2023-04-23 LAB — COLOGUARD: COLOGUARD: NEGATIVE

## 2023-05-28 ENCOUNTER — Other Ambulatory Visit: Payer: BLUE CROSS/BLUE SHIELD

## 2023-05-29 ENCOUNTER — Encounter: Payer: BLUE CROSS/BLUE SHIELD | Admitting: Family Medicine

## 2023-05-29 ENCOUNTER — Other Ambulatory Visit: Payer: BLUE CROSS/BLUE SHIELD

## 2023-05-29 DIAGNOSIS — E669 Obesity, unspecified: Secondary | ICD-10-CM

## 2023-05-29 DIAGNOSIS — I10 Essential (primary) hypertension: Secondary | ICD-10-CM

## 2023-05-29 DIAGNOSIS — Z0000 Encounter for general adult medical examination without abnormal findings: Secondary | ICD-10-CM

## 2023-05-29 DIAGNOSIS — R3912 Poor urinary stream: Secondary | ICD-10-CM | POA: Diagnosis not present

## 2023-05-29 DIAGNOSIS — N401 Enlarged prostate with lower urinary tract symptoms: Secondary | ICD-10-CM | POA: Diagnosis not present

## 2023-05-30 LAB — COMPLETE METABOLIC PANEL WITH GFR
AG Ratio: 1.8 (calc) (ref 1.0–2.5)
ALT: 14 U/L (ref 9–46)
AST: 22 U/L (ref 10–35)
Albumin: 4.6 g/dL (ref 3.6–5.1)
Alkaline phosphatase (APISO): 66 U/L (ref 35–144)
BUN: 15 mg/dL (ref 7–25)
CO2: 29 mmol/L (ref 20–32)
Calcium: 9.6 mg/dL (ref 8.6–10.3)
Chloride: 106 mmol/L (ref 98–110)
Creat: 1.23 mg/dL (ref 0.70–1.30)
Globulin: 2.5 g/dL (ref 1.9–3.7)
Glucose, Bld: 91 mg/dL (ref 65–99)
Potassium: 4.2 mmol/L (ref 3.5–5.3)
Sodium: 141 mmol/L (ref 135–146)
Total Bilirubin: 0.5 mg/dL (ref 0.2–1.2)
Total Protein: 7.1 g/dL (ref 6.1–8.1)
eGFR: 71 mL/min/{1.73_m2} (ref >=60)

## 2023-05-30 LAB — CBC WITH DIFFERENTIAL/PLATELET
Absolute Lymphocytes: 1575 {cells}/uL (ref 850–3900)
Absolute Monocytes: 249 {cells}/uL (ref 200–950)
Basophils Absolute: 32 {cells}/uL (ref 0–200)
Basophils Relative: 0.9 %
Eosinophils Absolute: 70 {cells}/uL (ref 15–500)
Eosinophils Relative: 2 %
HCT: 46 % (ref 38.5–50.0)
Hemoglobin: 15.5 g/dL (ref 13.2–17.1)
MCH: 29.5 pg (ref 27.0–33.0)
MCHC: 33.7 g/dL (ref 32.0–36.0)
MCV: 87.5 fL (ref 80.0–100.0)
MPV: 9.9 fL (ref 7.5–12.5)
Monocytes Relative: 7.1 %
Neutro Abs: 1575 {cells}/uL (ref 1500–7800)
Neutrophils Relative %: 45 %
Platelets: 208 10*3/uL (ref 140–400)
RBC: 5.26 10*6/uL (ref 4.20–5.80)
RDW: 13.4 % (ref 11.0–15.0)
Total Lymphocyte: 45 %
WBC: 3.5 10*3/uL — ABNORMAL LOW (ref 3.8–10.8)

## 2023-05-30 LAB — TSH: TSH: 1.71 m[IU]/L (ref 0.40–4.50)

## 2023-05-30 LAB — LIPID PANEL
Cholesterol: 191 mg/dL (ref <200)
HDL: 50 mg/dL (ref >=40)
LDL Cholesterol (Calc): 124 mg/dL — ABNORMAL HIGH
Non-HDL Cholesterol (Calc): 141 mg/dL — ABNORMAL HIGH (ref <130)
Total CHOL/HDL Ratio: 3.8 (calc) (ref <5.0)
Triglycerides: 71 mg/dL (ref <150)

## 2023-05-30 LAB — HEMOGLOBIN A1C
Hgb A1c MFr Bld: 5.3 %{Hb} (ref <5.7)
Mean Plasma Glucose: 105 mg/dL
eAG (mmol/L): 5.8 mmol/L

## 2023-05-30 LAB — PSA: PSA: 0.35 ng/mL (ref <=4.00)

## 2023-06-01 ENCOUNTER — Ambulatory Visit (INDEPENDENT_AMBULATORY_CARE_PROVIDER_SITE_OTHER): Payer: BC Managed Care – PPO | Admitting: Family Medicine

## 2023-06-01 ENCOUNTER — Encounter: Payer: Self-pay | Admitting: Family Medicine

## 2023-06-01 VITALS — BP 138/88 | Ht 73.0 in | Wt 279.6 lb

## 2023-06-01 DIAGNOSIS — I10 Essential (primary) hypertension: Secondary | ICD-10-CM

## 2023-06-01 DIAGNOSIS — E669 Obesity, unspecified: Secondary | ICD-10-CM | POA: Diagnosis not present

## 2023-06-01 DIAGNOSIS — E7800 Pure hypercholesterolemia, unspecified: Secondary | ICD-10-CM

## 2023-06-01 DIAGNOSIS — Z0000 Encounter for general adult medical examination without abnormal findings: Secondary | ICD-10-CM

## 2023-06-01 NOTE — Patient Instructions (Addendum)
Thank you for coming to the office today.  WBC mild low today! This is likely a chronic stable problem not a concern. Seems to be your baseline  No cholesterol medicine required today. We can monitor going forward.  The 10-year ASCVD risk score (Arnett DK, et al., 2019) is: 6.6%   Values used to calculate the score:     Age: 52 years     Sex: Male     Is Non-Hispanic African American: Yes     Diabetic: No     Tobacco smoker: No     Systolic Blood Pressure: 138 mmHg     Is BP treated: No     HDL Cholesterol: 50 mg/dL     Total Cholesterol: 191 mg/dL  Lipid Panel     Component Value Date/Time   CHOL 191 05/29/2023 0816   TRIG 71 05/29/2023 0816   HDL 50 05/29/2023 0816   CHOLHDL 3.8 05/29/2023 0816   LDLCALC 124 (H) 05/29/2023 0816     We discussed the Heart Scan - think about this, if you want to pursue the order - let me know! Coronary Calcium Score Cardiac CT Scan. This is a screening test for patients aged 17-50+ with cardiovascular risk factors or who are healthy but would be interested in Cardiovascular Screening for heart disease. Even if there is a family history of heart disease, this imaging can be useful. Typically it can be done every 5+ years or at a different timeline we agree on  The scan will look at the chest and mainly focus on the heart and identify early signs of calcium build up or blockages within the heart arteries. It is not 100% accurate for identifying blockages or heart disease, but it is useful to help [place] predict who may have some early changes or be at risk in the future for a heart attack or cardiovascular problem.  The results are reviewed by a Cardiologist and they will document the results. It should become available on MyChart. Typically the results are divided into percentiles based on other patients of the same demographic and age. So it will compare your risk to others similar to you. If you have a higher score >99 or higher percentile  >75%tile, it is recommended to consider Statin cholesterol therapy and or referral to Cardiologist. I will try to help explain your results and if we have questions we can contact the Cardiologist.  You will be contacted for scheduling. Usually it is done at any imaging facility through [facility], [facility] or [facility].  The cost is $99 flat fee total and it does not go through insurance, so no authorization is required.   Please schedule a Follow-up Appointment to: Return in about 6 months (around 11/29/2023) for 6 month follow-up HTN, HLD, updates (Lab after if need).  If you have any other questions or concerns, please feel free to call the office or send a message through MyChart. You may also schedule an earlier appointment if necessary.  Additionally, you may be receiving a survey about your experience at our office within a few days to 1 week by e-mail or mail. We value your feedback.  Saralyn Pilar, DO [facility], [place]

## 2023-06-01 NOTE — Progress Notes (Unsigned)
Subjective:    Patient ID: Dean Morton, male    DOB: 04-01-1971, 52 y.o.   MRN: [id number]  Dean Morton is a 52 y.o. male presenting on 06/01/2023 for Annual Exam (Discuss labs )   HPI  Discussed the use of AI scribe software for clinical note transcription with the patient, who gave verbal consent to proceed.  History of Present Illness              ***A1c 5.3, excellent results Healthy diet  GERD follow up Resolved Off PPI Omeprazole he has improved his diet significantly, avoiding fried greasy and acidic foods. Overall improved.   Low Back Pain Right Rotator Cuff Followed by [facility] for spine / back and also seen for R shoulder 12/22 started Ibuprofen 800mg  x 3 a day and Tylenol, for about 2 weeks then improved and came off med, then returned for Right shoulder rotator cuff impingement and restarted Ibuprofen and then several days later, resulted in gastritis symptoms see above   Costochondritis - resolved   R>L Axillary edema Incidental finding. Non tender. No nodular density Fam history   CHRONIC HTN: Reports elevated BP previously chart review shows high readings 2018+ 140-150 / 90 avg Home BP readings 160-170s / 100s, then it improved.  Here BP readings done with some variance Prior history treated BP before at [facility]. He was having issues with anxiety/mood. Prior was on Hydrochlorothiazide previously, had tapered down dose. Current Meds - never on med Diet - limits salt intake Denies CP, dyspnea, HA, edema, dizziness / lightheadedness   Obesity BMI >36 Goal to improve exercise and stay more active   ***Mild low WBC Over past 5+ years, 3-4 range mostly Asymptatomic   BPH LUTS He has variable symptoms worsening now. Seems to have some urge for urination and some lower pelvic cramping at times. He will try to void and then have reduced urinary flow, episodic worse. Past history DRE unremarkable.   AUA BPH Symptom Score over past 1 month 1.  Sensation of not emptying bladder post void - 0 2. Urinate less than 2 hour after finish last void - 3 > 1 3. Start/Stop several times during void - 2 > 1 4. Difficult to postpone urination - 0 5. Weak urinary stream - 3 > 1 6. Push or strain urination - 2  > 1 7. Nocturia - 1 times   Total Score: 11 > 5 (Mild BPH symptoms)  ***CAC score order????  Health Maintenance:  Flu Shot decline Shingles vaccine decline  PSA 0.35 negative (last time 0.9) 2024  Cologuard negative 04/17/23, next due 3 years, 2027.     06/01/2023    3:20 PM 11/25/2022   10:28 AM 08/27/2022    9:59 AM  Depression screen PHQ 2/9  Decreased Interest 0 0 0  Down, Depressed, Hopeless 0 0 0  PHQ - 2 Score 0 0 0  Altered sleeping   0  Tired, decreased energy   0  Change in appetite   0  Feeling bad or failure about yourself    0  Trouble concentrating   0  Moving slowly or fidgety/restless   0  Suicidal thoughts   0  PHQ-9 Score   0  Difficult doing work/chores   Not difficult at all       06/01/2023    3:20 PM 11/25/2022   10:28 AM 08/27/2022    9:59 AM  GAD 7 : Generalized Anxiety Score  Nervous, Anxious, on  Edge 0 0 0  Control/stop worrying 0 0 0  Worry too much - different things 0 0 0  Trouble relaxing 0 0 0  Restless 0 0 0  Easily annoyed or irritable 0 0 0  Afraid - awful might happen 0 0   Total GAD 7 Score 0 0   Anxiety Difficulty Not difficult at all  Not difficult at all     Past Medical History:  Diagnosis Date   Allergy    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,000 +)   Anxiety    Hypertension    took BP meds with panic attacks   Pseudofolliculitis barbae    Past Surgical History:  Procedure Laterality Date   UMBILICAL HERNIA REPAIR     Social History   Socioeconomic History   Marital status: Married    Spouse name: Not on file   Number of children: Not on file   Years of education: Not on file   Highest education level: Not on file  Occupational History   Not  on file  Tobacco Use   Smoking status: Never   Smokeless tobacco: Never  Substance and Sexual Activity   Alcohol use: No   Drug use: No   Sexual activity: Yes    Birth control/protection: None  Other Topics Concern   Not on file  Social History Narrative   Not on file   Social Determinants of Health   Financial Resource Strain: Low Risk  (06/01/2023)   Overall Financial Resource Strain (CARDIA)    Difficulty of Paying Living Expenses: Not hard at all  Food Insecurity: No Food Insecurity (06/01/2023)   Hunger Vital Sign    Worried About Running Out of Food in the Last Year: Never true    Ran Out of Food in the Last Year: Never true  Transportation Needs: No Transportation Needs (06/01/2023)   PRAPARE - Administrator, Civil Service (Medical): No    Lack of Transportation (Non-Medical): No  Physical Activity: Insufficiently Active (06/01/2023)   Exercise Vital Sign    Days of Exercise per Week: 3 days    Minutes of Exercise per Session: 40 min  Stress: No Stress Concern Present (06/01/2023)   [employer] of Occupational Health - Occupational Stress Questionnaire    Feeling of Stress : Not at all  Social Connections: Socially Integrated (06/01/2023)   Social Connection and Isolation Panel [NHANES]    Frequency of Communication with Friends and Family: More than three times a week    Frequency of Social Gatherings with Friends and Family: Three times a week    Attends Religious Services: More than 4 times per year    Active Member of Clubs or Organizations: Yes    Attends Banker Meetings: 1 to 4 times per year    Marital Status: Married  Catering manager Violence: Not At Risk (06/01/2023)   Humiliation, Afraid, Rape, and Kick questionnaire    Fear of Current or Ex-Partner: No    Emotionally Abused: No    Physically Abused: No    Sexually Abused: No   Family History  Problem Relation Age of Onset   Hypertension Mother    Stroke Maternal  Grandmother    No current outpatient medications on file prior to visit.   No current facility-administered medications on file prior to visit.    Review of Systems Per HPI unless specifically indicated above     Objective:    BP 138/88   Ht 6\' 1"  (  1.854 m)   Wt 279 lb 9.6 oz (126.8 kg)   BMI 36.89 kg/m   Wt Readings from Last 3 Encounters:  06/01/23 279 lb 9.6 oz (126.8 kg)  03/30/23 282 lb (127.9 kg)  11/25/22 277 lb (125.6 kg)    Physical Exam  Results for orders placed or performed in visit on 05/29/23  TSH  Result Value Ref Range   TSH 1.71 0.40 - 4.50 mIU/L  PSA  Result Value Ref Range   PSA 0.35 < OR = 4.00 ng/mL  Lipid panel  Result Value Ref Range   Cholesterol 191 <200 mg/dL   HDL 50 > OR = 40 mg/dL   Triglycerides 71 <161 mg/dL   LDL Cholesterol (Calc) 124 (H) mg/dL (calc)   Total CHOL/HDL Ratio 3.8 <5.0 (calc)   Non-HDL Cholesterol (Calc) 141 (H) <130 mg/dL (calc)  Hemoglobin W9U  Result Value Ref Range   Hgb A1c MFr Bld 5.3 <5.7 % of total Hgb   Mean Plasma Glucose 105 mg/dL   eAG (mmol/L) 5.8 mmol/L  CBC with Differential/Platelet  Result Value Ref Range   WBC 3.5 (L) 3.8 - 10.8 Thousand/uL   RBC 5.26 4.20 - 5.80 Million/uL   Hemoglobin 15.5 13.2 - 17.1 g/dL   HCT 04.5 40.9 - 81.1 %   MCV 87.5 80.0 - 100.0 fL   MCH 29.5 27.0 - 33.0 pg   MCHC 33.7 32.0 - 36.0 g/dL   RDW 91.4 78.2 - 95.6 %   Platelets 208 140 - 400 Thousand/uL   MPV 9.9 7.5 - 12.5 fL   Neutro Abs 1,575 1,500 - 7,800 cells/uL   Absolute Lymphocytes 1,575 850 - 3,900 cells/uL   Absolute Monocytes 249 200 - 950 cells/uL   Eosinophils Absolute 70 15 - 500 cells/uL   Basophils Absolute 32 0 - 200 cells/uL   Neutrophils Relative % 45 %   Total Lymphocyte 45.0 %   Monocytes Relative 7.1 %   Eosinophils Relative 2.0 %   Basophils Relative 0.9 %  COMPLETE METABOLIC PANEL WITH GFR  Result Value Ref Range   Glucose, Bld 91 65 - 99 mg/dL   BUN 15 7 - 25 mg/dL   Creat 2.13 0.86 -  5.78 mg/dL   eGFR 71 > OR = 60 IO/NGE/9.52W4   BUN/Creatinine Ratio SEE NOTE: 6 - 22 (calc)   Sodium 141 135 - 146 mmol/L   Potassium 4.2 3.5 - 5.3 mmol/L   Chloride 106 98 - 110 mmol/L   CO2 29 20 - 32 mmol/L   Calcium 9.6 8.6 - 10.3 mg/dL   Total Protein 7.1 6.1 - 8.1 g/dL   Albumin 4.6 3.6 - 5.1 g/dL   Globulin 2.5 1.9 - 3.7 g/dL (calc)   AG Ratio 1.8 1.0 - 2.5 (calc)   Total Bilirubin 0.5 0.2 - 1.2 mg/dL   Alkaline phosphatase (APISO) 66 35 - 144 U/L   AST 22 10 - 35 U/L   ALT 14 9 - 46 U/L      Assessment & Plan:   Problem List Items Addressed This Visit     Essential hypertension   Relevant Orders   CT CARDIAC SCORING (SELF PAY ONLY)   Obesity (BMI 35.0-39.9 without comorbidity)   Pure hypercholesterolemia   Relevant Orders   CT CARDIAC SCORING (SELF PAY ONLY)   Other Visit Diagnoses     Annual physical exam    -  Primary        Updated Health Maintenance information Reviewed recent  lab results with patient Encouraged improvement to lifestyle with diet and exercise Goal of weight loss   Assessment and Plan              Orders Placed This Encounter  Procedures   CT CARDIAC SCORING (SELF PAY ONLY)    Standing Status:   Future    Standing Expiration Date:   05/31/2024    Order Specific Question:   Preferred imaging location?    Answer:   [facility]    No orders of the defined types were placed in this encounter.    Follow up plan: Return in about 6 months (around 11/29/2023) for 6 month follow-up HTN, HLD, updates (Lab after if need).  Saralyn Pilar, DO [facility] 06/01/2023, 4:00 PM

## 2023-06-26 ENCOUNTER — Ambulatory Visit
Admission: RE | Admit: 2023-06-26 | Discharge: 2023-06-26 | Disposition: A | Discharge status: Home / Self Care | Payer: Self-pay | Source: Ambulatory Visit | Attending: Family Medicine | Admitting: Family Medicine

## 2023-06-26 DIAGNOSIS — I10 Essential (primary) hypertension: Secondary | ICD-10-CM | POA: Insufficient documentation

## 2023-06-26 DIAGNOSIS — E7800 Pure hypercholesterolemia, unspecified: Secondary | ICD-10-CM | POA: Insufficient documentation

## 2023-07-13 ENCOUNTER — Ambulatory Visit (INDEPENDENT_AMBULATORY_CARE_PROVIDER_SITE_OTHER): Payer: BC Managed Care – PPO | Admitting: Internal Medicine

## 2023-07-13 ENCOUNTER — Encounter: Payer: Self-pay | Admitting: Internal Medicine

## 2023-07-13 VITALS — BP 134/84 | Ht 73.0 in | Wt 280.2 lb

## 2023-07-13 DIAGNOSIS — B370 Candidal stomatitis: Secondary | ICD-10-CM

## 2023-07-13 DIAGNOSIS — M545 Low back pain, unspecified: Secondary | ICD-10-CM

## 2023-07-13 MED ORDER — NYSTATIN 100000 UNIT/ML MT SUSP: 5.0000 mL | Freq: Four times a day (QID) | OROMUCOSAL | 0 refills | Status: DC

## 2023-07-13 NOTE — Patient Instructions (Signed)

## 2023-07-13 NOTE — Progress Notes (Signed)
 Subjective:    Patient ID: Dean Morton, male    DOB: [birth year], 53 y.o.   MRN: [id number]  HPI  Discussed the use of AI scribe software for clinical note transcription with the patient, who gave verbal consent to proceed.  History of Present Illness   The patient, with a history of back pain, presents with a week-long history of left lower back discomfort. The pain began after lifting heavy batteries, and is described as a warm sensation, improving over time but still noticeable. The patient denies radiation of the pain, numbness, tingling, or weakness in the legs. There have been no associated bowel or bladder issues. The patient took Tylenol  once, which seemed to help, and the pain has been gradually improving. The patient has a history of similar back pain episodes in the past, related to heavy lifting.  Additionally, the patient reports a sensation of something growing on his tongue, which he noticed while brushing his teeth. Upon inspection, he found a white coating on the back of the tongue, which could not be scraped off. The patient denies any associated itching, burning, or alteration in taste. The patient denies recent use of antibiotics, steroids, or any new medications. He also denies any history of smoking.       Review of Systems   Past Medical History:  Diagnosis Date   Allergy    workplace [place] hissing roaches (10,000 +)   Anxiety    Hypertension    took BP meds with panic attacks   Pseudofolliculitis barbae     No current outpatient medications on file.   No current facility-administered medications for this visit.    Allergies  Allergen Reactions   Amoxicillin-Pot Clavulanate Nausea Only    Family History  Problem Relation Age of Onset   Hypertension Mother    Stroke Maternal Grandmother     Social History   Socioeconomic History   Marital status: Married    Spouse name: Not on file   Number of children: Not on file   Years of  education: Not on file   Highest education level: Not on file  Occupational History   Not on file  Tobacco Use   Smoking status: Never   Smokeless tobacco: Never  Substance and Sexual Activity   Alcohol use: No   Drug use: No   Sexual activity: Yes    Birth control/protection: None  Other Topics Concern   Not on file  Social History Narrative   Not on file   Social Drivers of Health   Financial Resource Strain: Low Risk  (06/01/2023)   Overall Financial Resource Strain (CARDIA)    Difficulty of Paying Living Expenses: Not hard at all  Food Insecurity: No Food Insecurity (06/01/2023)   Hunger Vital Sign    Worried About Running Out of Food in the Last Year: Never true    Ran Out of Food in the Last Year: Never true  Transportation Needs: No Transportation Needs (06/01/2023)   PRAPARE - Administrator, Civil Service (Medical): No    Lack of Transportation (Non-Medical): No  Physical Activity: Insufficiently Active (06/01/2023)   Exercise Vital Sign    Days of Exercise per Week: 3 days    Minutes of Exercise per Session: 40 min  Stress: No Stress Concern Present (06/01/2023)   [employer] of Occupational Health - Occupational Stress Questionnaire    Feeling of Stress : Not at all  Social Connections: Socially Integrated (06/01/2023)   Social  Connection and Isolation Panel [NHANES]    Frequency of Communication with Friends and Family: More than three times a week    Frequency of Social Gatherings with Friends and Family: Three times a week    Attends Religious Services: More than 4 times per year    Active Member of Clubs or Organizations: Yes    Attends Banker Meetings: 1 to 4 times per year    Marital Status: Married  Catering Manager Violence: Not At Risk (06/01/2023)   Humiliation, Afraid, Rape, and Kick questionnaire    Fear of Current or Ex-Partner: No    Emotionally Abused: No    Physically Abused: No    Sexually Abused: No      Constitutional: Denies fever, malaise, fatigue, headache or abrupt weight changes.  HEENT: Pt reports white coating on tongue. Denies eye pain, eye redness, ear pain, ringing in the ears, wax buildup, runny nose, nasal congestion, bloody nose, or sore throat. Respiratory: Denies difficulty breathing, shortness of breath, cough or sputum production.   Cardiovascular: Denies chest pain, chest tightness, palpitations or swelling in the hands or feet.  Gastrointestinal: Denies abdominal pain, bloating, constipation, diarrhea or blood in the stool.  GU: Denies urgency, frequency, pain with urination, burning sensation, blood in urine, odor or discharge. Musculoskeletal: Patient reports left side low back pain.  Denies decrease in range of motion, difficulty with gait, or joint swelling.  Skin: Denies redness, rashes, lesions or ulcercations.  Neurological: Denies dizziness, difficulty with memory, difficulty with speech or problems with balance and coordination.  Psych: Denies anxiety, depression, SI/HI.  No other specific complaints in a complete review of systems (except as listed in HPI above).      Objective:   Physical Exam  BP 134/84 (BP Location: Left Arm, Patient Position: Sitting, Cuff Size: Large)   Ht 6' 1 (1.854 m)   Wt 280 lb 3.2 oz (127.1 kg)   BMI 36.97 kg/m   Wt Readings from Last 3 Encounters:  06/01/23 279 lb 9.6 oz (126.8 kg)  03/30/23 282 lb (127.9 kg)  11/25/22 277 lb (125.6 kg)    General: Appears his stated age, obese, in NAD. Skin: Warm, dry and intact.  HEENT: Head: normal shape and size;  Throat/Mouth: Teeth present, mucosa pink and moist,  white coating noted of tongue, no exudate, lesions or ulcerations noted.  Neck:  No adenopathy noted.  Cardiovascular: Normal rate and rhythm. S1,S2 noted.  No murmur, rubs or gallops noted.  Pulmonary/Chest: Normal effort and positive vesicular breath sounds. No respiratory distress. No wheezes, rales or ronchi  noted.  Musculoskeletal: No difficulty with gait.  Neurological: Alert and oriented.    BMET    Component Value Date/Time   NA 141 05/29/2023 0816   NA 142 07/17/2012 0135   K 4.2 05/29/2023 0816   K 3.6 07/17/2012 0135   CL 106 05/29/2023 0816   CL 108 (H) 07/17/2012 0135   CO2 29 05/29/2023 0816   CO2 27 07/17/2012 0135   GLUCOSE 91 05/29/2023 0816   GLUCOSE 85 07/17/2012 0135   BUN 15 05/29/2023 0816   BUN 11 07/17/2012 0135   CREATININE 1.23 05/29/2023 0816   CALCIUM 9.6 05/29/2023 0816   CALCIUM 8.6 07/17/2012 0135   GFRNONAA >60 07/21/2022 1935   GFRNONAA >60 07/17/2012 0135   GFRAA >60 09/10/2017 1319   GFRAA >60 07/17/2012 0135    Lipid Panel     Component Value Date/Time   CHOL 191 05/29/2023 0816  TRIG 71 05/29/2023 0816   HDL 50 05/29/2023 0816   CHOLHDL 3.8 05/29/2023 0816   LDLCALC 124 (H) 05/29/2023 0816    CBC    Component Value Date/Time   WBC 3.5 (L) 05/29/2023 0816   RBC 5.26 05/29/2023 0816   HGB 15.5 05/29/2023 0816   HGB 14.3 07/17/2012 0135   HCT 46.0 05/29/2023 0816   HCT 41.8 07/17/2012 0135   PLT 208 05/29/2023 0816   PLT 182 07/17/2012 0135   MCV 87.5 05/29/2023 0816   MCV 87 07/17/2012 0135   MCH 29.5 05/29/2023 0816   MCHC 33.7 05/29/2023 0816   RDW 13.4 05/29/2023 0816   RDW 13.2 07/17/2012 0135   LYMPHSABS 1.8 07/21/2022 1935   MONOABS 0.3 07/21/2022 1935   EOSABS 70 05/29/2023 0816   BASOSABS 32 05/29/2023 0816    Hgb A1C Lab Results  Component Value Date   HGBA1C 5.3 05/29/2023            Assessment & Plan:  Assessment and Plan    Lower Back Pain Likely muscular in nature due to heavy lifting. No radiation of pain, numbness, tingling, or weakness in the legs. No urinary or bowel symptoms. Pain has improved over the past week. -Advise to avoid heavy lifting for the next week. -Continue with heat, massage, and stretching. -Use previously prescribed muscle relaxer at bedtime as needed, he declined new RX at  this time.  Oral Thrush White coating on the tongue, likely fungal in nature. No recent antibiotic or steroid use. No altered sense of taste. -Prescribe Nystatin  mouthwash, swish and spit four times a day for five days.      Follow-up with your PCP as previously scheduled Angeline Laura, NP

## 2023-07-24 ENCOUNTER — Ambulatory Visit (INDEPENDENT_AMBULATORY_CARE_PROVIDER_SITE_OTHER): Payer: BC Managed Care – PPO | Admitting: Internal Medicine

## 2023-07-24 ENCOUNTER — Encounter: Payer: Self-pay | Admitting: Internal Medicine

## 2023-07-24 VITALS — BP 138/80 | HR 91 | Ht 73.0 in | Wt 277.6 lb

## 2023-07-24 DIAGNOSIS — J00 Acute nasopharyngitis [common cold]: Secondary | ICD-10-CM

## 2023-07-24 MED ORDER — AZITHROMYCIN 250 MG PO TABS: ORAL_TABLET | ORAL | 0 refills | Status: DC

## 2023-07-24 MED ORDER — FLUTICASONE PROPIONATE 50 MCG/ACT NA SUSP: 2.0000 | Freq: Every day | NASAL | 0 refills | Status: DC

## 2023-07-24 NOTE — Progress Notes (Signed)
Subjective:    Patient ID: Dean Morton, male    DOB: 1970/09/06, 53 y.o.   MRN: [id number]  HPI  Discussed the use of AI scribe software for clinical note transcription with the patient, who gave verbal consent to proceed.  Mr. Cotugno, who has a history of seasonal allergies, began experiencing symptoms of an upper respiratory infection on Saturday. The initial symptom was a tingling sensation in the throat, which progressed to nasal congestion by Monday. Accompanying these symptoms were subjective fevers, characterized by feeling cold and trembling upon waking from sleep. The patient denied any sore throat, but reported a runny nose. There was no shortness of breath.  The patient also reported gastrointestinal upset, including diarrhea, which he attributed to postnasal drainage. There was no mention of any other gastrointestinal symptoms such as nausea or vomiting.  The patient's wife and daughter had been ill prior to the onset of his symptoms, suggesting a possible source of infection.  In terms of management, the patient initially self-medicated with Theraflu and Mucinex, and also used natural remedies such as honey and tea. He also had Flonase (fluticasone) nasal spray at home, which he had used in the past for similar symptoms.  The patient denied any history of asthma or COPD, and had not received a flu shot. He reported experiencing similar symptoms annually, suggesting a possible seasonal pattern to his illness.       Review of Systems   Past Medical History:  Diagnosis Date   Allergy    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,000 +)   Anxiety    Hypertension    took BP meds with panic attacks   Pseudofolliculitis barbae     Current Outpatient Medications  Medication Sig Dispense Refill   nystatin (MYCOSTATIN) 100000 UNIT/ML suspension Take 5 mLs (500,000 Units total) by mouth 4 (four) times daily. 100 mL 0   No current facility-administered medications for this  visit.    Allergies  Allergen Reactions   Amoxicillin-Pot Clavulanate Nausea Only    Family History  Problem Relation Age of Onset   Hypertension Mother    Stroke Maternal Grandmother     Social History   Socioeconomic History   Marital status: Married    Spouse name: Not on file   Number of children: Not on file   Years of education: Not on file   Highest education level: Not on file  Occupational History   Not on file  Tobacco Use   Smoking status: Never   Smokeless tobacco: Never  Substance and Sexual Activity   Alcohol use: No   Drug use: No   Sexual activity: Yes    Birth control/protection: None  Other Topics Concern   Not on file  Social History Narrative   Not on file   Social Drivers of Health   Financial Resource Strain: Low Risk  (06/01/2023)   Overall Financial Resource Strain (CARDIA)    Difficulty of Paying Living Expenses: Not hard at all  Food Insecurity: No Food Insecurity (06/01/2023)   Hunger Vital Sign    Worried About Running Out of Food in the Last Year: Never true    Ran Out of Food in the Last Year: Never true  Transportation Needs: No Transportation Needs (06/01/2023)   PRAPARE - Administrator, Civil Service (Medical): No    Lack of Transportation (Non-Medical): No  Physical Activity: Insufficiently Active (06/01/2023)   Exercise Vital Sign    Days of Exercise per Week: 3  days    Minutes of Exercise per Session: 40 min  Stress: No Stress Concern Present (06/01/2023)   [employer] of Occupational Health - Occupational Stress Questionnaire    Feeling of Stress : Not at all  Social Connections: Socially Integrated (06/01/2023)   Social Connection and Isolation Panel [NHANES]    Frequency of Communication with Friends and Family: More than three times a week    Frequency of Social Gatherings with Friends and Family: Three times a week    Attends Religious Services: More than 4 times per year    Active Member of Clubs  or Organizations: Yes    Attends Banker Meetings: 1 to 4 times per year    Marital Status: Married  Catering manager Violence: Not At Risk (06/01/2023)   Humiliation, Afraid, Rape, and Kick questionnaire    Fear of Current or Ex-Partner: No    Emotionally Abused: No    Physically Abused: No    Sexually Abused: No     Constitutional: Pt reports headache. Denies fever, malaise, fatigue, or abrupt weight changes.  HEENT: Pt reports runny nose, nasal congestion. Denies eye pain, eye redness, ear pain, ringing in the ears, wax buildup, bloody nose, or sore throat. Respiratory: Denies difficulty breathing, shortness of breath, cough or sputum production.   Cardiovascular: Denies chest pain, chest tightness, palpitations or swelling in the hands or feet.  Gastrointestinal: Pt reports diarrhea. Denies abdominal pain, bloating, constipation, or blood in the stool.  GU: Denies urgency, frequency, pain with urination, burning sensation, blood in urine, odor or discharge. Musculoskeletal: Denies decrease in range of motion, difficulty with gait, muscle pain or joint pain and swelling.  Skin: Denies redness, rashes, lesions or ulcercations.  Neurological: Denies dizziness, difficulty with memory, difficulty with speech or problems with balance and coordination.  Psych: Denies anxiety, depression, SI/HI.  No other specific complaints in a complete review of systems (except as listed in HPI above).      Objective:   Physical Exam  BP 138/80 (BP Location: Left Arm, Patient Position: Sitting, Cuff Size: Large)   Pulse 91   Ht 6\' 1"  (1.854 m)   Wt 277 lb 9.6 oz (125.9 kg)   SpO2 97%   BMI 36.62 kg/m   Wt Readings from Last 3 Encounters:  07/13/23 280 lb 3.2 oz (127.1 kg)  06/01/23 279 lb 9.6 oz (126.8 kg)  03/30/23 282 lb (127.9 kg)    General: Appears his stated age, obese, in NAD. Skin: Warm, dry and intact. No rashes noted. HEENT: Head: normal shape and size, no sinus  pressure noted; Eyes: sclera white, no icterus, conjunctiva pink, PERRLA and EOMs intact; Ears: Tm's gray and intact, normal light reflex; Nose: mucosa boggy and moist, turbinates swollen; Throat/Mouth: Teeth present, mucosa pink and moist, no exudate, lesions or ulcerations noted.  Neck:  No adenopathy noted. Cardiovascular: Normal rate and rhythm. S1,S2 noted.  No murmur, rubs or gallops noted.  Pulmonary/Chest: Normal effort and positive vesicular breath sounds. No respiratory distress. No wheezes, rales or ronchi noted.  Neurological: Alert and oriented.   BMET    Component Value Date/Time   NA 141 05/29/2023 0816   NA 142 07/17/2012 0135   K 4.2 05/29/2023 0816   K 3.6 07/17/2012 0135   CL 106 05/29/2023 0816   CL 108 (H) 07/17/2012 0135   CO2 29 05/29/2023 0816   CO2 27 07/17/2012 0135   GLUCOSE 91 05/29/2023 0816   GLUCOSE 85 07/17/2012 0135  BUN 15 05/29/2023 0816   BUN 11 07/17/2012 0135   CREATININE 1.23 05/29/2023 0816   CALCIUM 9.6 05/29/2023 0816   CALCIUM 8.6 07/17/2012 0135   GFRNONAA >60 07/21/2022 1935   GFRNONAA >60 07/17/2012 0135   GFRAA >60 09/10/2017 1319   GFRAA >60 07/17/2012 0135    Lipid Panel     Component Value Date/Time   CHOL 191 05/29/2023 0816   TRIG 71 05/29/2023 0816   HDL 50 05/29/2023 0816   CHOLHDL 3.8 05/29/2023 0816   LDLCALC 124 (H) 05/29/2023 0816    CBC    Component Value Date/Time   WBC 3.5 (L) 05/29/2023 0816   RBC 5.26 05/29/2023 0816   HGB 15.5 05/29/2023 0816   HGB 14.3 07/17/2012 0135   HCT 46.0 05/29/2023 0816   HCT 41.8 07/17/2012 0135   PLT 208 05/29/2023 0816   PLT 182 07/17/2012 0135   MCV 87.5 05/29/2023 0816   MCV 87 07/17/2012 0135   MCH 29.5 05/29/2023 0816   MCHC 33.7 05/29/2023 0816   RDW 13.4 05/29/2023 0816   RDW 13.2 07/17/2012 0135   LYMPHSABS 1.8 07/21/2022 1935   MONOABS 0.3 07/21/2022 1935   EOSABS 70 05/29/2023 0816   BASOSABS 32 05/29/2023 0816    Hgb A1C Lab Results  Component Value  Date   HGBA1C 5.3 05/29/2023            Assessment & Plan:  Assessment and Plan    Upper Respiratory Infection Symptoms of headache, nasal congestion, postnasal drip, and diarrhea. No shortness of breath, ear pain, or sore throat. Nasal examination revealed swelling. History of similar symptoms annually. -Start Z-Pak (Azithromycin) to address any bacterial infection. -Continue Flonase (Fluticasone) for nasal congestion. -Recommend over-the-counter Zyrtec for postnasal drip. -If symptoms do not improve by late next week, patient to contact office for follow-up.     Follow-up with your PCP as previously scheduled Nicki Reaper, NP

## 2023-07-24 NOTE — Patient Instructions (Signed)

## 2023-07-28 ENCOUNTER — Encounter: Payer: Self-pay | Admitting: Internal Medicine

## 2023-07-28 ENCOUNTER — Telehealth (INDEPENDENT_AMBULATORY_CARE_PROVIDER_SITE_OTHER): Payer: BLUE CROSS/BLUE SHIELD | Admitting: Internal Medicine

## 2023-07-28 DIAGNOSIS — J00 Acute nasopharyngitis [common cold]: Secondary | ICD-10-CM | POA: Diagnosis not present

## 2023-07-28 NOTE — Patient Instructions (Signed)

## 2023-07-28 NOTE — Progress Notes (Signed)
Virtual Visit via Video Note  I connected with Dean Morton on 07/28/23 at  3:20 PM EST by a video enabled telemedicine application and verified that I am speaking with the correct person using two identifiers.  Location: Patient: Home Provider: Office  Person's participating in this video call: Nicki Reaper, NP-C and   I discussed the limitations of evaluation and management by telemedicine and the availability of in person appointments. The patient expressed understanding and agreed to proceed.  History of Present Illness:  Patient wanting to follow-up from 1/17 visit.  His initial symptoms started with headache, runny nose, nasal congestion, slight cough and diarrhea.  He was diagnosed with a common cold, treated with azithromycin, cetirizine and flonase.  He reports he is currently on his last day of antibiotics and feels 95% better.  He continues to have a slight cough and chest congestion.  He reports the cough is productive of clear mucus.  He denies headache, runny nose, nasal congestion, ear pain, sore throat, shortness of breath, chest pain, nausea, vomiting, diarrhea.  He denies fever, chills or bodyaches.  In addition to the azithromycin, he has been taking Mucinex max with significant improvement in symptoms.  He just wanted to follow-up to see what else he could do about this cough.  Past Medical History:  Diagnosis Date   Allergy    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,000 +)   Anxiety    Hypertension    took BP meds with panic attacks   Pseudofolliculitis barbae     Current Outpatient Medications  Medication Sig Dispense Refill   azithromycin (ZITHROMAX) 250 MG tablet Take 2 tabs today, then 1 tab daily x 4 days 6 tablet 0   fluticasone (FLONASE) 50 MCG/ACT nasal spray Place 2 sprays into both nostrils daily. 116 g 0   nystatin (MYCOSTATIN) 100000 UNIT/ML suspension Take 5 mLs (500,000 Units total) by mouth 4 (four) times daily. (Patient not taking: Reported on  07/24/2023) 100 mL 0   No current facility-administered medications for this visit.    Allergies  Allergen Reactions   Amoxicillin-Pot Clavulanate Nausea Only    Family History  Problem Relation Age of Onset   Hypertension Mother    Stroke Maternal Grandmother     Social History   Socioeconomic History   Marital status: Married    Spouse name: Not on file   Number of children: Not on file   Years of education: Not on file   Highest education level: Not on file  Occupational History   Not on file  Tobacco Use   Smoking status: Never   Smokeless tobacco: Never  Substance and Sexual Activity   Alcohol use: No   Drug use: No   Sexual activity: Yes    Birth control/protection: None  Other Topics Concern   Not on file  Social History Narrative   Not on file   Social Drivers of Health   Financial Resource Strain: Low Risk  (06/01/2023)   Overall Financial Resource Strain (CARDIA)    Difficulty of Paying Living Expenses: Not hard at all  Food Insecurity: No Food Insecurity (06/01/2023)   Hunger Vital Sign    Worried About Running Out of Food in the Last Year: Never true    Ran Out of Food in the Last Year: Never true  Transportation Needs: No Transportation Needs (06/01/2023)   PRAPARE - Administrator, Civil Service (Medical): No    Lack of Transportation (Non-Medical): No  Physical Activity:  Insufficiently Active (06/01/2023)   Exercise Vital Sign    Days of Exercise per Week: 3 days    Minutes of Exercise per Session: 40 min  Stress: No Stress Concern Present (06/01/2023)   [employer] of Occupational Health - Occupational Stress Questionnaire    Feeling of Stress : Not at all  Social Connections: Socially Integrated (06/01/2023)   Social Connection and Isolation Panel [NHANES]    Frequency of Communication with Friends and Family: More than three times a week    Frequency of Social Gatherings with Friends and Family: Three times a week     Attends Religious Services: More than 4 times per year    Active Member of Clubs or Organizations: Yes    Attends Banker Meetings: 1 to 4 times per year    Marital Status: Married  Catering manager Violence: Not At Risk (06/01/2023)   Humiliation, Afraid, Rape, and Kick questionnaire    Fear of Current or Ex-Partner: No    Emotionally Abused: No    Physically Abused: No    Sexually Abused: No     Constitutional: Denies fever, malaise, fatigue, headache or abrupt weight changes.  HEENT: Denies eye pain, eye redness, ear pain, ringing in the ears, wax buildup, runny nose, nasal congestion, bloody nose, or sore throat. Respiratory: Patient reports cough.  Denies difficulty breathing, shortness of breath.   Cardiovascular: Denies chest pain, chest tightness, palpitations or swelling in the hands or feet.  Gastrointestinal: Denies abdominal pain, bloating, constipation, diarrhea or blood in the stool.   No other specific complaints in a complete review of systems (except as listed in HPI above).  Observations/Objective:  Wt Readings from Last 3 Encounters:  07/24/23 277 lb 9.6 oz (125.9 kg)  07/13/23 280 lb 3.2 oz (127.1 kg)  06/01/23 279 lb 9.6 oz (126.8 kg)    General: Appears his stated age,  in NAD. HEENT: Nose: Slight congestion noted.; Throat/Mouth: No hoarseness noted.   Pulmonary/Chest: Normal effort. No respiratory distress. Neurological: Alert and oriented.   BMET    Component Value Date/Time   NA 141 05/29/2023 0816   NA 142 07/17/2012 0135   K 4.2 05/29/2023 0816   K 3.6 07/17/2012 0135   CL 106 05/29/2023 0816   CL 108 (H) 07/17/2012 0135   CO2 29 05/29/2023 0816   CO2 27 07/17/2012 0135   GLUCOSE 91 05/29/2023 0816   GLUCOSE 85 07/17/2012 0135   BUN 15 05/29/2023 0816   BUN 11 07/17/2012 0135   CREATININE 1.23 05/29/2023 0816   CALCIUM 9.6 05/29/2023 0816   CALCIUM 8.6 07/17/2012 0135   GFRNONAA >60 07/21/2022 1935   GFRNONAA >60 07/17/2012  0135   GFRAA >60 09/10/2017 1319   GFRAA >60 07/17/2012 0135    Lipid Panel     Component Value Date/Time   CHOL 191 05/29/2023 0816   TRIG 71 05/29/2023 0816   HDL 50 05/29/2023 0816   CHOLHDL 3.8 05/29/2023 0816   LDLCALC 124 (H) 05/29/2023 0816    CBC    Component Value Date/Time   WBC 3.5 (L) 05/29/2023 0816   RBC 5.26 05/29/2023 0816   HGB 15.5 05/29/2023 0816   HGB 14.3 07/17/2012 0135   HCT 46.0 05/29/2023 0816   HCT 41.8 07/17/2012 0135   PLT 208 05/29/2023 0816   PLT 182 07/17/2012 0135   MCV 87.5 05/29/2023 0816   MCV 87 07/17/2012 0135   MCH 29.5 05/29/2023 0816   MCHC 33.7 05/29/2023 0816  RDW 13.4 05/29/2023 0816   RDW 13.2 07/17/2012 0135   LYMPHSABS 1.8 07/21/2022 1935   MONOABS 0.3 07/21/2022 1935   EOSABS 70 05/29/2023 0816   BASOSABS 32 05/29/2023 0816    Hgb A1C Lab Results  Component Value Date   HGBA1C 5.3 05/29/2023       Assessment and Plan:  Acute nasopharyngitis, cough:  Encouraged him to complete his course of azithromycin as previously prescribed Continue cetirizine and Flonase OTC Offered Rx for prednisone taper x 6 days however he reports he prefers to stay away from steroids if at all possible Reassured him that this cough will likely improve with time and is common to linger after a cold Advised him that if his symptoms worsen, would consider sending an additional antibiotic if symptoms worsen.  Advised him he would not need an additional office visit for this but could call or send a MyChart message.  Follow Up Instructions:    I discussed the assessment and treatment plan with the patient. The patient was provided an opportunity to ask questions and all were answered. The patient agreed with the plan and demonstrated an understanding of the instructions.   The patient was advised to call back or seek an in-person evaluation if the symptoms worsen or if the condition fails to improve as anticipated.   Nicki Reaper,  NP

## 2023-07-30 ENCOUNTER — Encounter: Payer: Self-pay | Admitting: Internal Medicine

## 2023-07-30 ENCOUNTER — Ambulatory Visit (INDEPENDENT_AMBULATORY_CARE_PROVIDER_SITE_OTHER): Payer: BC Managed Care – PPO | Admitting: Internal Medicine

## 2023-07-30 VITALS — BP 132/80 | HR 82 | Ht 73.0 in | Wt 278.6 lb

## 2023-07-30 DIAGNOSIS — R058 Other specified cough: Secondary | ICD-10-CM

## 2023-07-30 NOTE — Progress Notes (Signed)
Subjective:    Patient ID: Dean Morton, male    DOB: 31-May-1971, 53 y.o.   MRN: [id number]  HPI  Discussed the use of AI scribe software for clinical note transcription with the patient, who gave verbal consent to proceed.   The patient, with a history of upper respiratory symptoms, reports a two-week duration of symptoms that have been gradually improving. Initially, the patient experienced a cough productive of thick mucus, which has since lessened in severity. The patient has been managing symptoms with increased fluid intake and Mucinex, which he found beneficial.  The patient was previously seen on January 17th and was prescribed a Z-Pak, Flonase, and Zyrtec. A follow-up video visit on January 21st revealed improved symptoms, though a cough persisted. The patient declined prednisone at that time, opting to continue with Mucinex Max.  The patient reports that the cough remains, though it is less severe and not associated with colored mucus. He denies shortness of breath and chest pain. He also reports episodes of diarrhea, which he attributes to yogurt consumption and a recent salad intake, intended to counteract the effects of antibiotics on gut flora.  The patient seeks advice on early intervention strategies for future upper respiratory symptoms, as he experiences them annually. He expresses a preference for over-the-counter remedies before resorting to antibiotics.       Review of Systems   Past Medical History:  Diagnosis Date   Allergy    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,000 +)   Anxiety    Hypertension    took BP meds with panic attacks   Pseudofolliculitis barbae     Current Outpatient Medications  Medication Sig Dispense Refill   azithromycin (ZITHROMAX) 250 MG tablet Take 2 tabs today, then 1 tab daily x 4 days 6 tablet 0   fluticasone (FLONASE) 50 MCG/ACT nasal spray Place 2 sprays into both nostrils daily. 116 g 0   No current facility-administered  medications for this visit.    Allergies  Allergen Reactions   Amoxicillin-Pot Clavulanate Nausea Only    Family History  Problem Relation Age of Onset   Hypertension Mother    Stroke Maternal Grandmother     Social History   Socioeconomic History   Marital status: Married    Spouse name: Not on file   Number of children: Not on file   Years of education: Not on file   Highest education level: Not on file  Occupational History   Not on file  Tobacco Use   Smoking status: Never   Smokeless tobacco: Never  Substance and Sexual Activity   Alcohol use: No   Drug use: No   Sexual activity: Yes    Birth control/protection: None  Other Topics Concern   Not on file  Social History Narrative   Not on file   Social Drivers of Health   Financial Resource Strain: Low Risk  (06/01/2023)   Overall Financial Resource Strain (CARDIA)    Difficulty of Paying Living Expenses: Not hard at all  Food Insecurity: No Food Insecurity (06/01/2023)   Hunger Vital Sign    Worried About Running Out of Food in the Last Year: Never true    Ran Out of Food in the Last Year: Never true  Transportation Needs: No Transportation Needs (06/01/2023)   PRAPARE - Administrator, Civil Service (Medical): No    Lack of Transportation (Non-Medical): No  Physical Activity: Insufficiently Active (06/01/2023)   Exercise Vital Sign    Days  of Exercise per Week: 3 days    Minutes of Exercise per Session: 40 min  Stress: No Stress Concern Present (06/01/2023)   [employer] of Occupational Health - Occupational Stress Questionnaire    Feeling of Stress : Not at all  Social Connections: Socially Integrated (06/01/2023)   Social Connection and Isolation Panel [NHANES]    Frequency of Communication with Friends and Family: More than three times a week    Frequency of Social Gatherings with Friends and Family: Three times a week    Attends Religious Services: More than 4 times per year     Active Member of Clubs or Organizations: Yes    Attends Banker Meetings: 1 to 4 times per year    Marital Status: Married  Catering manager Violence: Not At Risk (06/01/2023)   Humiliation, Afraid, Rape, and Kick questionnaire    Fear of Current or Ex-Partner: No    Emotionally Abused: No    Physically Abused: No    Sexually Abused: No     Constitutional: Denies headache, fever, malaise, fatigue, or abrupt weight changes.  HEENT:  Denies eye pain, eye redness, ear pain, ringing in the ears, wax buildup, runny nose, nasal congestion, bloody nose, or sore throat. Respiratory: Pt reports cough. Denies difficulty breathing, shortness of breath .   Cardiovascular: Denies chest pain, chest tightness, palpitations or swelling in the hands or feet.  Gastrointestinal: Pt reports intermittent diarrhea. Denies abdominal pain, bloating, constipation, or blood in the stool.  Musculoskeletal: Denies decrease in range of motion, difficulty with gait, muscle pain or joint pain and swelling.  Skin: Denies redness, rashes, lesions or ulcercations.  Neurological: Denies dizziness, difficulty with memory, difficulty with speech or problems with balance   No other specific complaints in a complete review of systems (except as listed in HPI above).      Objective:   Physical Exam BP 132/80 (BP Location: Left Arm, Patient Position: Sitting, Cuff Size: Large)   Pulse 82   Ht 6\' 1"  (1.854 m)   Wt 278 lb 9.6 oz (126.4 kg)   SpO2 98%   BMI 36.76 kg/m    Wt Readings from Last 3 Encounters:  07/24/23 277 lb 9.6 oz (125.9 kg)  07/13/23 280 lb 3.2 oz (127.1 kg)  06/01/23 279 lb 9.6 oz (126.8 kg)    General: Appears his stated age, obese, in NAD. Skin: Warm, dry and intact. No rashes noted. HEENT: Head: normal shape and size, no sinus pressure noted; Eyes: sclera white, no icterus, conjunctiva pink, PERRLA and EOMs intact; Ears: Tm's gray and intact, normal light reflex; Nose: mucosa boggy  and moist, turbinates swollen; Throat/Mouth: Teeth present, mucosa pink and moist, no exudate, lesions or ulcerations noted.  Neck:  No adenopathy noted. Cardiovascular: Normal rate and rhythm. S1,S2 noted.  No murmur, rubs or gallops noted.  Pulmonary/Chest: Normal effort and positive vesicular breath sounds. No respiratory distress. No wheezes, rales or ronchi noted.  Neurological: Alert and oriented.   BMET    Component Value Date/Time   NA 141 05/29/2023 0816   NA 142 07/17/2012 0135   K 4.2 05/29/2023 0816   K 3.6 07/17/2012 0135   CL 106 05/29/2023 0816   CL 108 (H) 07/17/2012 0135   CO2 29 05/29/2023 0816   CO2 27 07/17/2012 0135   GLUCOSE 91 05/29/2023 0816   GLUCOSE 85 07/17/2012 0135   BUN 15 05/29/2023 0816   BUN 11 07/17/2012 0135   CREATININE 1.23 05/29/2023 0816  CALCIUM 9.6 05/29/2023 0816   CALCIUM 8.6 07/17/2012 0135   GFRNONAA >60 07/21/2022 1935   GFRNONAA >60 07/17/2012 0135   GFRAA >60 09/10/2017 1319   GFRAA >60 07/17/2012 0135    Lipid Panel     Component Value Date/Time   CHOL 191 05/29/2023 0816   TRIG 71 05/29/2023 0816   HDL 50 05/29/2023 0816   CHOLHDL 3.8 05/29/2023 0816   LDLCALC 124 (H) 05/29/2023 0816    CBC    Component Value Date/Time   WBC 3.5 (L) 05/29/2023 0816   RBC 5.26 05/29/2023 0816   HGB 15.5 05/29/2023 0816   HGB 14.3 07/17/2012 0135   HCT 46.0 05/29/2023 0816   HCT 41.8 07/17/2012 0135   PLT 208 05/29/2023 0816   PLT 182 07/17/2012 0135   MCV 87.5 05/29/2023 0816   MCV 87 07/17/2012 0135   MCH 29.5 05/29/2023 0816   MCHC 33.7 05/29/2023 0816   RDW 13.4 05/29/2023 0816   RDW 13.2 07/17/2012 0135   LYMPHSABS 1.8 07/21/2022 1935   MONOABS 0.3 07/21/2022 1935   EOSABS 70 05/29/2023 0816   BASOSABS 32 05/29/2023 0816    Hgb A1C Lab Results  Component Value Date   HGBA1C 5.3 05/29/2023            Assessment & Plan:    Assessment and Plan    Postviral Cough Syndrome Improvement in symptoms with  residual cough. No signs of bronchitis or pneumonia on examination. No postnasal drip observed. -Continue Zyrtec or Mucinex as needed. -Use over-the-counter cough syrup or cough drops for symptomatic relief.   Follow-up with your PCP as previously scheduled Nicki Reaper, NP

## 2023-07-30 NOTE — Patient Instructions (Signed)

## 2023-10-23 ENCOUNTER — Encounter: Payer: Self-pay | Admitting: Family Medicine

## 2023-10-23 ENCOUNTER — Ambulatory Visit (INDEPENDENT_AMBULATORY_CARE_PROVIDER_SITE_OTHER): Payer: Self-pay | Admitting: Family Medicine

## 2023-10-23 VITALS — BP 140/88 | HR 88 | Ht 73.0 in | Wt 277.2 lb

## 2023-10-23 DIAGNOSIS — J0110 Acute frontal sinusitis, unspecified: Secondary | ICD-10-CM

## 2023-10-23 DIAGNOSIS — J029 Acute pharyngitis, unspecified: Secondary | ICD-10-CM

## 2023-10-23 DIAGNOSIS — H6992 Unspecified Eustachian tube disorder, left ear: Secondary | ICD-10-CM

## 2023-10-23 DIAGNOSIS — J3089 Other allergic rhinitis: Secondary | ICD-10-CM

## 2023-10-23 LAB — POCT RAPID STREP A (OFFICE): Rapid Strep A Screen: NEGATIVE

## 2023-10-23 NOTE — Patient Instructions (Addendum)
 Thank you for coming to the office today.  Keep on allergy meds and recommend resume the Flonase  nasal spray  Prefer to start meds earlier  Peroxyl Oral debridement mouthwash for ulceration in mouth to help it heal, 1-2 times per day for 1-2 weeks or less or until healed. Follow instructions, OTC  No other sign of infection today  Try sudafed behind the counter. As needed.  Strep negative  If not improving by next week, contact back and we can order a back up plan - Azithromycin  ZPak or something similar.    Please schedule a Follow-up Appointment to: Return if symptoms worsen or fail to improve.  If you have any other questions or concerns, please feel free to call the office or send a message through MyChart. You may also schedule an earlier appointment if necessary.  Additionally, you may be receiving a survey about your experience at our office within a few days to 1 week by e-mail or mail. We value your feedback.  Domingo Friend, DO [facility], [place]

## 2023-10-23 NOTE — Progress Notes (Signed)
 Subjective:    Patient ID: Dean Morton, male    DOB: 11/13/1970, 53 y.o.   MRN: [id number]  Dean Morton is a 53 y.o. male presenting on 10/23/2023 for Sore Throat and Allergies  Patient presents for a same day appointment.   HPI  Discussed the use of AI scribe software for clinical note transcription with the patient, who gave verbal consent to proceed.  History of Present Illness   Dean Morton is a 53 year old male who presents with throat irritation and sneezing.  He has been experiencing throat irritation starting at the top of his throat near the drainage area, associated with sneezing. These symptoms began two days ago in the afternoon after being outside near the woods, tampering with his vehicles. The irritation is intermittently bothersome but not constant. No sore throat, but he mentions an unusual sensation in the throat area.  No fever, but he recalls waking up cold one night and needing to use a blanket. He also mentions waking up sweaty around the collar for the past couple of nights, which he attributes to the room being hot.  No significant respiratory symptoms such as congestion, cough, or nasal drainage. However, he notes some pressure behind the left eardrum and a small inflamed spot inside his mouth, described as a superficial ulceration.  He has been taking Zyrtec as needed for allergies and occasionally uses Claritin. He also has a nasal spray from a previous episode, which he used a couple of times last week when he felt sinus pressure.  He has not noticed anyone else around him being sick.  He has been managing his symptoms with over-the-counter allergy medications and reports feeling better, as evidenced by his ability to mow the lawn while wearing a mask.      06/01/2023    3:20 PM 11/25/2022   10:28 AM 08/27/2022    9:59 AM  Depression screen PHQ 2/9  Decreased Interest 0 0 0  Down, Depressed, Hopeless 0 0 0  PHQ - 2 Score 0 0 0  Altered  sleeping   0  Tired, decreased energy   0  Change in appetite   0  Feeling bad or failure about yourself    0  Trouble concentrating   0  Moving slowly or fidgety/restless   0  Suicidal thoughts   0  PHQ-9 Score   0  Difficult doing work/chores   Not difficult at all       06/01/2023    3:20 PM 11/25/2022   10:28 AM 08/27/2022    9:59 AM  GAD 7 : Generalized Anxiety Score  Nervous, Anxious, on Edge 0 0 0  Control/stop worrying 0 0 0  Worry too much - different things 0 0 0  Trouble relaxing 0 0 0  Restless 0 0 0  Easily annoyed or irritable 0 0 0  Afraid - awful might happen 0 0   Total GAD 7 Score 0 0   Anxiety Difficulty Not difficult at all  Not difficult at all    Social History   Tobacco Use   Smoking status: Never   Smokeless tobacco: Never  Substance Use Topics   Alcohol use: No   Drug use: No    Review of Systems Per HPI unless specifically indicated above     Objective:    BP (!) 140/88 (BP Location: Left Arm, Cuff Size: Normal)   Pulse 88   Ht 6\' 1"  (1.854 m)   Wt 277  lb 4 oz (125.8 kg)   SpO2 99%   BMI 36.58 kg/m   Wt Readings from Last 3 Encounters:  10/23/23 277 lb 4 oz (125.8 kg)  07/30/23 278 lb 9.6 oz (126.4 kg)  07/24/23 277 lb 9.6 oz (125.9 kg)    Physical Exam Vitals and nursing note reviewed.  Constitutional:      General: He is not in acute distress.    Appearance: Normal appearance. He is well-developed. He is not diaphoretic.     Comments: Well-appearing, comfortable, cooperative  HENT:     Head: Normocephalic and atraumatic.     Right Ear: Tympanic membrane, ear canal and external ear normal. There is no impacted cerumen.     Left Ear: Ear canal and external ear normal. There is no impacted cerumen.     Ears:     Comments: L TM effusion clear    Nose: Congestion present.     Mouth/Throat:     Mouth: Mucous membranes are moist.     Pharynx: No oropharyngeal exudate or posterior oropharyngeal erythema.     Comments: Left  oropharynx with small 2 mm small ulceration superficial with some granulation white appearance, no evidence of abscess and no appearance of exudates. Posterior oropharynx is clear without erythema or swelling or asymmetry. Eyes:     General:        Right eye: No discharge.        Left eye: No discharge.     Conjunctiva/sclera: Conjunctivae normal.  Cardiovascular:     Rate and Rhythm: Normal rate.  Pulmonary:     Effort: Pulmonary effort is normal.  Skin:    General: Skin is warm and dry.     Findings: No erythema or rash.  Neurological:     Mental Status: He is alert and oriented to person, place, and time.  Psychiatric:        Mood and Affect: Mood normal.        Behavior: Behavior normal.        Thought Content: Thought content normal.     Comments: Well groomed, good eye contact, normal speech and thoughts     Results for orders placed or performed in visit on 10/23/23  POCT rapid strep A   Collection Time: 10/23/23  2:39 PM  Result Value Ref Range   Rapid Strep A Screen Negative Negative      Assessment & Plan:   Problem List Items Addressed This Visit     Environmental and seasonal allergies   Other Visit Diagnoses       Sore throat    -  Primary   Relevant Orders   POCT rapid strep A (Completed)     Acute non-recurrent frontal sinusitis         Acute dysfunction of left eustachian tube            Allergic Rhinitis Symptoms likely due to environmental allergens. Sinus swelling and inflammation present without bacterial infection.  Strep test negative.  Continue OTC anti histamine - Use Flonase  nasal spray as needed. - Consider Sudafed for decongestion. - Contact clinic if symptoms worsen for potential azithromycin  prescription if persistent or second sickening  Oral Ulceration Not consistent with tonsil stone or exudative pharyngitis Small ulceration likely from irritation or minor trauma. No abscess or infection. Expected to heal spontaneously. - Use  [employer] Peroxyl mouthwash 1-2 times per day for 1-2 weeks or until healed. - Contact clinic if ulceration does not improve.  Orders Placed This Encounter  Procedures   POCT rapid strep A    No orders of the defined types were placed in this encounter.   Follow up plan: Return if symptoms worsen or fail to improve.   Domingo Friend, DO [facility] 10/23/2023, 2:24 PM

## 2023-11-03 ENCOUNTER — Encounter: Payer: Self-pay | Admitting: Family Medicine

## 2023-11-03 ENCOUNTER — Ambulatory Visit (INDEPENDENT_AMBULATORY_CARE_PROVIDER_SITE_OTHER): Payer: Self-pay | Admitting: Family Medicine

## 2023-11-03 VITALS — BP 120/80 | HR 79 | Ht 73.0 in | Wt 279.1 lb

## 2023-11-03 DIAGNOSIS — K219 Gastro-esophageal reflux disease without esophagitis: Secondary | ICD-10-CM

## 2023-11-03 DIAGNOSIS — H6992 Unspecified Eustachian tube disorder, left ear: Secondary | ICD-10-CM

## 2023-11-03 MED ORDER — PREDNISONE 10 MG PO TABS: ORAL_TABLET | ORAL | 0 refills | Status: DC

## 2023-11-03 NOTE — Patient Instructions (Addendum)
 Thank you for coming to the office today.  You have some Eustachian Tube Dysfunction, this problem is usually caused by some deeper sinus swelling and pressure, causing difficulty of eustachian tubes to clear fluid from behind ear drum. You can have ear pain, pressure, fullness, loss of hearing. Often related to sinus symptoms and sometimes with sinusitis or infection or allergy symptoms.  Treatment: Start 6 day taper with meal for prednisone, no ibuprofen while on this.  - Start using nasal steroid spray, Flonase  2 sprays in each nostril every day for at least 4-6 weeks, and maybe longer - Start OTC allergy medicine (Claritin, Zyrtec, or Allegra - or generics) once daily - For significant ear/sinus pressure, try OTC decongestant such as Sudafed or similar medicines, included in DayQuil, take this for up to 1 week or less, no longer - AVOID Afrin nasal spray, if you use this do not use more than 3 days or can make sinus swelling worse  Okay to take the Prilosec OTC 20mg  daily before breakfast for 2 weeks, if this successful, we can order more or higher dose if the stomach is a problem.  If any significant worsening, loss of hearing, constant pain, fever/chills, or concern for infection - notify office and we can send in an antibiotic. Or if just persistent pressure that is not improving, you may contact me back within 1 week and we can consider a brief course of oral steroid prednisone for 3 days only to help reduce swelling, as discussed this is not ideal treatment and can cause side effects.  Consider ENT referral in 1 week  Please schedule a Follow-up Appointment to: Return if symptoms worsen or fail to improve.  If you have any other questions or concerns, please feel free to call the office or send a message through MyChart. You may also schedule an earlier appointment if necessary.  Additionally, you may be receiving a survey about your experience at our office within a few days to 1 week  by e-mail or mail. We value your feedback.  Domingo Friend, DO [facility], [place]

## 2023-11-03 NOTE — Progress Notes (Signed)
 Subjective:    Patient ID: Dean Morton, male    DOB: 03-31-1971, 53 y.o.   MRN: [id number]  Dean Morton is a 53 y.o. male presenting on 11/03/2023 for Eustachian Tube Dysfunction   HPI  Discussed the use of AI scribe software for clinical note transcription with the patient, who gave verbal consent to proceed.  History of Present Illness   Dean Morton is a 53 year old male who presents with upper left throat pain and ear discomfort.  Last seen for similar issue 10/23/23, thought to have allergic rhinosinusitis, given flonase , sudafed, allergy treatments, also had oral ulceration recommended peroxyl. Symptoms improved  Now acute worsening onset L throat and ear vs jaw pain.  He has been experiencing soreness in the upper left throat area since yesterday evening, which radiates to the ear and causes difficulty eating. No fever, chills, or general malaise, but the pain is significant. 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  He notes some discoloration and splotches on his tongue affecting his taste, though he is unsure if this is related to his current symptoms. He recalls a previous episode about eleven days ago, attributed to allergies, with nasal and sinus issues that have since improved. He used mouthwash to heal a sore in his mouth.  He has been using nasal sprays and took Sudafed, which provided some relief previously. He started taking Prilosec this morning due to recent indigestion issues. He describes a sensation of 'breathing out of his ear' and mentions hearing a buzzing sound, initially thinking it might be a bug in his ear. No dizziness or lightheadedness.  GERD / Heartburn He has a history of indigestion and has been experiencing green stools and gas, leading him to start a course of Prilosec 20mg  OTC. He is unsure if acid reflux is contributing to his current symptoms.     11/03/2023    1:22 PM 11/03/2023    1:21 PM 06/01/2023    3:20 PM   Depression screen PHQ 2/9  Decreased Interest 0 0 0  Down, Depressed, Hopeless 0 0 0  PHQ - 2 Score 0 0 0  Altered sleeping 0    Tired, decreased energy 0    Change in appetite 0    Feeling bad or failure about yourself  0    Trouble concentrating 0    Moving slowly or fidgety/restless 0    Suicidal thoughts 0    PHQ-9 Score 0    Difficult doing work/chores Not difficult at all         11/03/2023    1:22 PM 06/01/2023    3:20 PM 11/25/2022   10:28 AM 08/27/2022    9:59 AM  GAD 7 : Generalized Anxiety Score  Nervous, Anxious, on Edge 0 0 0 0  Control/stop worrying 0 0 0 0  Worry too much - different things 0 0 0 0  Trouble relaxing 0 0 0 0  Restless 0 0 0 0  Easily annoyed or irritable 0 0 0 0  Afraid - awful might happen 0 0 0   Total GAD 7 Score 0 0 0   Anxiety Difficulty Not difficult at all Not difficult at all  Not difficult at all    Social History   Tobacco Use   Smoking status: Never   Smokeless tobacco: Never  Substance Use Topics   Alcohol use: No   Drug use: No    Review of Systems Per HPI unless specifically  indicated above     Objective:    BP 120/80 (BP Location: Left Arm, Cuff Size: Normal)   Pulse 79   Ht 6\' 1"  (1.854 m)   Wt 279 lb 2 oz (126.6 kg)   SpO2 92%   BMI 36.83 kg/m   Wt Readings from Last 3 Encounters:  11/03/23 279 lb 2 oz (126.6 kg)  10/23/23 277 lb 4 oz (125.8 kg)  07/30/23 278 lb 9.6 oz (126.4 kg)    Physical Exam Vitals and nursing note reviewed.  Constitutional:      General: He is not in acute distress.    Appearance: Normal appearance. He is well-developed. He is not diaphoretic.     Comments: Well-appearing, comfortable, cooperative  HENT:     Head: Normocephalic and atraumatic.     Comments: R side jaw has clicking at TMJ but left appears smooth or normal    Right Ear: Ear canal and external ear normal. There is no impacted cerumen.     Left Ear: Ear canal and external ear normal. There is no impacted cerumen.      Ears:     Comments: Bilateral L>R with effusion some fullness behind TM without purulence or erythema    Nose: No congestion.     Mouth/Throat:     Mouth: Mucous membranes are moist.     Pharynx: Oropharynx is clear. No oropharyngeal exudate or posterior oropharyngeal erythema.  Eyes:     General:        Right eye: No discharge.        Left eye: No discharge.     Conjunctiva/sclera: Conjunctivae normal.  Cardiovascular:     Rate and Rhythm: Normal rate.  Pulmonary:     Effort: Pulmonary effort is normal. No respiratory distress.     Breath sounds: Normal breath sounds. No wheezing, rhonchi or rales.  Lymphadenopathy:     Cervical: No cervical adenopathy.  Skin:    General: Skin is warm and dry.     Findings: No erythema or rash.  Neurological:     Mental Status: He is alert and oriented to person, place, and time.  Psychiatric:        Mood and Affect: Mood normal.        Behavior: Behavior normal.        Thought Content: Thought content normal.     Comments: Well groomed, good eye contact, normal speech and thoughts     Results for orders placed or performed in visit on 10/23/23  POCT rapid strep A   Collection Time: 10/23/23  2:39 PM  Result Value Ref Range   Rapid Strep A Screen Negative Negative      Assessment & Plan:   Problem List Items Addressed This Visit     Gastroesophageal reflux disease without esophagitis   Other Visit Diagnoses       Acute dysfunction of left eustachian tube    -  Primary   Relevant Medications   predniSONE (DELTASONE) 10 MG tablet        Eustachian tube dysfunction L Symptoms consistent with Eustachian tube dysfunction, likely due to inner ear pressure and fluid. No infection identified in ear or throat or sinuses or lungs. Exam suggests R TMJ clicking but L seems normal on exam. He has full range of motion of opening jaw. So not restricted. Throat is normal appearance without infection. Or asymmetry.  He may have referred  pain in this area, difficult to localize. Does not seem to  be ear or neck or oral at this time. Suspect eustachian tube swelling pressure explains most of his symptoms, affected by movement and swallowing  - Prescribe 6-day taper of oral prednisone for anti inflammatory properties - Restart nasal steroid spray (Flonase ). Since he stopped after 1 week. - Advise against ibuprofen, Aleve, or Advil while on prednisone. - Keep on allergy med  - Consider ENT referral if symptoms persist. - Discuss potential side effects of prednisone.  GERD / Indigestion Possible underlying cause of some throat pain but this seems less likely given history. Continue Prilosec OTC 20 mg daily for 2 weeks. - Evaluate effectiveness after 2 weeks; consider dose increase or stronger medication if needed.        No orders of the defined types were placed in this encounter.   Meds ordered this encounter  Medications   predniSONE (DELTASONE) 10 MG tablet    Sig: Take 6 tabs with breakfast Day 1, 5 tabs Day 2, 4 tabs Day 3, 3 tabs Day 4, 2 tabs Day 5, 1 tab Day 6.    Dispense:  21 tablet    Refill:  0    Follow up plan: Return if symptoms worsen or fail to improve.   Domingo Friend, DO [facility] 11/03/2023, 1:27 PM

## 2023-11-05 ENCOUNTER — Telehealth: Payer: Self-pay

## 2023-11-05 DIAGNOSIS — J029 Acute pharyngitis, unspecified: Secondary | ICD-10-CM

## 2023-11-05 MED ORDER — AZITHROMYCIN 250 MG PO TABS: ORAL_TABLET | ORAL | 0 refills | Status: DC

## 2023-11-05 NOTE — Telephone Encounter (Signed)
 Copied from CRM [id number]. Topic: Clinical - Medical Advice >> Nov 05, 2023  9:37 AM Dorthula Gavel H wrote: Reason for CRM: Pt is wanting a call from doctor or nurse about predniSONE  (DELTASONE ) 10 MG tablet, Says he hasn't took it yet and the pharmacist stated he may not need to take it due to him seeming to be getting better, but to discuss with his pcp.

## 2023-11-05 NOTE — Addendum Note (Signed)
 Addended by: Raina Bunting on: 11/05/2023 01:19 PM   Modules accepted: Orders

## 2023-11-05 NOTE — Telephone Encounter (Signed)
 Called patient. He now has some "pus" appearance on his tonsil. He has fevers reported at night. These symptoms were not present at last visit. But he does feel better  I advised that we can cover for bacterial infection with antibiotic. He has GI intolerance nausea and diarrhea on Augmentin. That is preferred, but he chooses alternate option, I advised we can try Zpak azithromycin  first and he can take prednisone  if pain swelling inflammation or hold if does not have those. His pain is actually better.  He will contact back for follow-up if not improving, we can reconsider the Augmentin.  Domingo Friend, DO [facility] 11/05/2023, 1:19 PM

## 2023-12-02 ENCOUNTER — Ambulatory Visit (INDEPENDENT_AMBULATORY_CARE_PROVIDER_SITE_OTHER): Payer: Self-pay | Admitting: Family Medicine

## 2023-12-02 ENCOUNTER — Encounter: Payer: Self-pay | Admitting: Family Medicine

## 2023-12-02 VITALS — BP 144/92 | HR 74 | Ht 73.0 in | Wt 276.2 lb

## 2023-12-02 DIAGNOSIS — K4090 Unilateral inguinal hernia, without obstruction or gangrene, not specified as recurrent: Secondary | ICD-10-CM

## 2023-12-02 DIAGNOSIS — R222 Localized swelling, mass and lump, trunk: Secondary | ICD-10-CM

## 2023-12-02 DIAGNOSIS — I10 Essential (primary) hypertension: Secondary | ICD-10-CM

## 2023-12-02 NOTE — Progress Notes (Signed)
 Subjective:    Patient ID: Dean Morton, male    DOB: 09-21-1970, 53 y.o.   MRN: [id number]  Dean Morton is a 53 y.o. male presenting on 12/02/2023 for Rib Pain / Nodular Density and Inguinal Hernia   HPI  Discussed the use of AI scribe software for clinical note transcription with the patient, who gave verbal consent to proceed.  History of Present Illness   Dean Morton is a 53 year old male who presents with a possible inguinal hernia and chest wall discomfort.  He experiences discomfort in the RIGHT groin area, which is tender and more noticeable with straining or certain movements. He has a history of an umbilical hernia repaired in his late teens and now feels a similar bulge in the right inguinal region, associated with heavy lifting activities. No obvious herniation that he has to reduce. Has not seen specialist for this.  Left Chest Wall Nodule He has a concern about a bulge on his chest, described as a 'knot' that causes discomfort. This area is located under the left side of his ribcage and has been tender, especially with certain movements. The bulge was more pronounced previously but now appears to have decreased slightly.  He reports a sensation of numbness at the top of his mouth, similar to the feeling after dental work. This began last week, possibly related to sinus issues, and he has used Zyrtec spray for relief.  He mentions a history of high blood pressure. He is not currently on any blood pressure medication and recalls previous discussions about possibly treating it. No current back pain.  - Previously on meds. Has remained off. He was doing better before variety of symptoms and pain affecting him. He is not ready to restart medication         12/02/2023    9:48 AM 11/03/2023    1:22 PM 11/03/2023    1:21 PM  Depression screen PHQ 2/9  Decreased Interest 0 0 0  Down, Depressed, Hopeless 0 0 0  PHQ - 2 Score 0 0 0  Altered sleeping 0 0   Tired,  decreased energy 0 0   Change in appetite 0 0   Feeling bad or failure about yourself  0 0   Trouble concentrating 0 0   Moving slowly or fidgety/restless 0 0   Suicidal thoughts 0 0   PHQ-9 Score 0 0   Difficult doing work/chores  Not difficult at all        12/02/2023    9:49 AM 11/03/2023    1:22 PM 06/01/2023    3:20 PM 11/25/2022   10:28 AM  GAD 7 : Generalized Anxiety Score  Nervous, Anxious, on Edge 0 0 0 0  Control/stop worrying 0 0 0 0  Worry too much - different things 0 0 0 0  Trouble relaxing 0 0 0 0  Restless 0 0 0 0  Easily annoyed or irritable 0 0 0 0  Afraid - awful might happen 0 0 0 0  Total GAD 7 Score 0 0 0 0  Anxiety Difficulty  Not difficult at all Not difficult at all     Social History   Tobacco Use   Smoking status: Never   Smokeless tobacco: Never  Substance Use Topics   Alcohol use: No   Drug use: No    Review of Systems Per HPI unless specifically indicated above     Objective:     BP (!) 144/92 (BP  Location: Left Arm, Cuff Size: Normal)   Pulse 74   Ht 6\' 1"  (1.854 m)   Wt 276 lb 4 oz (125.3 kg)   SpO2 98%   BMI 36.45 kg/m   Wt Readings from Last 3 Encounters:  12/02/23 276 lb 4 oz (125.3 kg)  11/03/23 279 lb 2 oz (126.6 kg)  10/23/23 277 lb 4 oz (125.8 kg)    Physical Exam Vitals and nursing note reviewed.  Constitutional:      General: He is not in acute distress.    Appearance: Normal appearance. He is well-developed. He is not diaphoretic.     Comments: Well-appearing, comfortable, cooperative  HENT:     Head: Normocephalic and atraumatic.  Eyes:     General:        Right eye: No discharge.        Left eye: No discharge.     Conjunctiva/sclera: Conjunctivae normal.  Cardiovascular:     Rate and Rhythm: Normal rate.  Pulmonary:     Effort: Pulmonary effort is normal.  Abdominal:     Hernia: A hernia (R inguinal suspected hernia mild fullness and movement on exam without obvious hernia bulging on exam. but provoked  on valsalva maneuvers) is present.  Skin:    General: Skin is warm and dry.     Findings: Lesion (Left anterior chest wall costal cartilage area with nodular density over ribcage only. no other soft tissue density identified. mild tender to palpation.) present. No erythema or rash.  Neurological:     Mental Status: He is alert and oriented to person, place, and time.  Psychiatric:        Mood and Affect: Mood normal.        Behavior: Behavior normal.        Thought Content: Thought content normal.     Comments: Well groomed, good eye contact, normal speech and thoughts     Results for orders placed or performed in visit on 10/23/23  POCT rapid strep A   Collection Time: 10/23/23  2:39 PM  Result Value Ref Range   Rapid Strep A Screen Negative Negative      Assessment & Plan:   Problem List Items Addressed This Visit     Essential hypertension   Other Visit Diagnoses       Right inguinal hernia    -  Primary     Nodule of anterior chest wall            Right inguinal hernia Suspicious for mild or early-stage right inguinal hernia with symptoms exacerbated by lifting and straining. Prefers treatment due to current symptoms. - Recommend surgical evaluation for confirmation and potential treatment. - Discuss local surgical options at [facility] or [facility] in [place]. However he says currently uninsured so cost would be high. He may check cost / payment plan. - Discuss alternative options including [facility] care or [facility] services due to lack of insurance. He has used [facility] before for umbilical hernia years ago. - Advise avoiding heavy lifting and straining to prevent worsening.  No referral today since no insurance. I advised him to check into the above options and notify us  if need referral.  Left Costal Cartilage Nodule / Costochondritis Subtle protrusion or nodule on the left lower ribcage, likely related to rib cartilage. Differential includes  costochondritis or ribcage cartilage inflammation. - Recommend trial of topical anti-inflammatory gel, Voltaren, applied up to four times daily. - Discuss imaging (ultrasound MSK) for  further evaluation if symptoms persist or worsen. Note he has already had CT Chest 06/2023 for coronary calcium screen, and no identified abnormality in this area of concern.  Hypertension Blood pressure elevated, systolic 140+ diastolic around 90-92. Prefers to address other health issues first. We discussed goal for treating Hypertension if uncontrolled even if due to diet, pain, lifestyle, but he prefers to defer this at this time. - Monitor blood pressure and consider treatment if it remains elevated after addressing other health issues. - Discuss importance of managing blood pressure to prevent future complications.   No orders of the defined types were placed in this encounter.   No orders of the defined types were placed in this encounter.   Follow up plan: Return if symptoms worsen or fail to improve.   Domingo Friend, DO [facility] 12/02/2023, 9:10 AM

## 2023-12-02 NOTE — Patient Instructions (Addendum)
 Thank you for coming to the office today.  Likely a Inguinal Hernia Right side (groin) It can be mild or early.  Please call and check and find out cost and if this is possible.  [facility] [address] [place],  [place]  [address] Phone: [phone]  Other options would include [facility] or [place]  ----------------------------------------------  Rib seems to be inflammation swelling / nodular area on the COSTAL CARTILAGE. I don't believe it is anything deeper, I don't think it is heart or any other internal organs.  START anti inflammatory topical - OTC Voltaren (generic Diclofenac) topical 2-4 times a day as needed for pain swelling of affected joint for 1-2 weeks or longer.  -----------  I believe we do need to treat the blood pressure soon. I am still seeing elevated readings. It is possible to treat when you are ready with medication if you have persistent elevated readings.   Please schedule a Follow-up Appointment to: Return if symptoms worsen or fail to improve.  If you have any other questions or concerns, please feel free to call the office or send a message through MyChart. You may also schedule an earlier appointment if necessary.  Additionally, you may be receiving a survey about your experience at our office within a few days to 1 week by e-mail or mail. We value your feedback.  Domingo Friend, DO [facility] [place]

## 2023-12-06 DIAGNOSIS — K4090 Unilateral inguinal hernia, without obstruction or gangrene, not specified as recurrent: Secondary | ICD-10-CM

## 2023-12-06 HISTORY — DX: Unilateral inguinal hernia, without obstruction or gangrene, not specified as recurrent: K40.90

## 2023-12-09 ENCOUNTER — Encounter: Payer: Self-pay | Admitting: Surgery

## 2023-12-09 ENCOUNTER — Ambulatory Visit: Payer: Self-pay | Admitting: Surgery

## 2023-12-09 VITALS — BP 137/91 | HR 77 | Temp 98.9°F | Ht 73.0 in | Wt 268.0 lb

## 2023-12-09 DIAGNOSIS — K4091 Unilateral inguinal hernia, without obstruction or gangrene, recurrent: Secondary | ICD-10-CM

## 2023-12-09 DIAGNOSIS — K4090 Unilateral inguinal hernia, without obstruction or gangrene, not specified as recurrent: Secondary | ICD-10-CM

## 2023-12-09 NOTE — Patient Instructions (Signed)
 You have chose to have your hernia repaired. This will be done by Dr. Everlene Farrier at [facility].  If you are on any injectable weight loss medication, you will need to stop taking your GLP-1 injectable (weight loss) medications 8 days before your surgery to avoid any complications with anesthesia.   Please see your (blue) Pre-care information that you have been given today. Our surgery scheduler will call you to verify surgery date and to go over information.   You will need to arrange to be out of work for approximately 1-2 weeks and then you may return with a lifting restriction for 4 more weeks. If you have FMLA or Disability paperwork that needs to be filled out, please have your company fax your paperwork to [phone] or you may drop this by either office. This paperwork will be filled out within 3 days after your surgery has been completed.  You may have a bruise in your groin and also swelling and brusing in your testicle area. You may use ice 4-5 times daily for 15-20 minutes each time. Make sure that you place a barrier between you and the ice pack. To decrease the swelling, you may roll up a bath towel and place it vertically in between your thighs with your testicles resting on the towel. You will want to keep this area elevated as much as possible for several days following surgery.    Inguinal Hernia, Adult Muscles help keep everything in the body in its proper place. But if a weak spot in the muscles develops, something can poke through. That is called a hernia. When this happens in the lower part of the belly (abdomen), it is called an inguinal hernia. (It takes its name from a part of the body in this region called the inguinal canal.) A weak spot in the wall of muscles lets some fat or part of the small intestine bulge through. An inguinal hernia can develop at any age. Men get them more often than women. CAUSES  In adults, an inguinal hernia develops over time. It can be triggered  by: Suddenly straining the muscles of the lower abdomen. Lifting heavy objects. Straining to have a bowel movement. Difficult bowel movements (constipation) can lead to this. Constant coughing. This may be caused by smoking or lung disease. Being overweight. Being pregnant. Working at a job that requires long periods of standing or heavy lifting. Having had an inguinal hernia before. One type can be an emergency situation. It is called a strangulated inguinal hernia. It develops if part of the small intestine slips through the weak spot and cannot get back into the abdomen. The blood supply can be cut off. If that happens, part of the intestine may die. This situation requires emergency surgery. SYMPTOMS  Often, a small inguinal hernia has no symptoms. It is found when a healthcare provider does a physical exam. Larger hernias usually have symptoms.  In adults, symptoms may include: A lump in the groin. This is easier to see when the person is standing. It might disappear when lying down. In men, a lump in the scrotum. Pain or burning in the groin. This occurs especially when lifting, straining or coughing. A dull ache or feeling of pressure in the groin. Signs of a strangulated hernia can include: A bulge in the groin that becomes very painful and tender to the touch. A bulge that turns red or purple. Fever, nausea and vomiting. Inability to have a bowel movement or to pass gas.  DIAGNOSIS  To decide if you have an inguinal hernia, a healthcare provider will probably do a physical examination. This will include asking questions about any symptoms you have noticed. The healthcare provider might feel the groin area and ask you to cough. If an inguinal hernia is felt, the healthcare provider may try to slide it back into the abdomen. Usually no other tests are needed. TREATMENT  Treatments can vary. The size of the hernia makes a difference. Options include: Watchful waiting. This is often  suggested if the hernia is small and you have had no symptoms. No medical procedure will be done unless symptoms develop. You will need to watch closely for symptoms. If any occur, contact your healthcare provider right away. Surgery. This is used if the hernia is larger or you have symptoms. Open surgery. This is usually an outpatient procedure (you will not stay overnight in a hospital). An cut (incision) is made through the skin in the groin. The hernia is put back inside the abdomen. The weak area in the muscles is then repaired by herniorrhaphy or hernioplasty. Herniorrhaphy: in this type of surgery, the weak muscles are sewn back together. Hernioplasty: a patch or mesh is used to close the weak area in the abdominal wall. Laparoscopy. In this procedure, a surgeon makes small incisions. A thin tube with a tiny video camera (called a laparoscope) is put into the abdomen. The surgeon repairs the hernia with mesh by looking with the video camera and using two long instruments. HOME CARE INSTRUCTIONS  After surgery to repair an inguinal hernia: You will need to take pain medicine prescribed by your healthcare provider. Follow all directions carefully. You will need to take care of the wound from the incision. Your activity will be restricted for awhile. This will probably include no heavy lifting for several weeks. You also should not do anything too active for a few weeks. When you can return to work will depend on the type of job that you have. During "watchful waiting" periods, you should: Maintain a healthy weight. Eat a diet high in fiber (fruits, vegetables and whole grains). Drink plenty of fluids to avoid constipation. This means drinking enough water and other liquids to keep your urine clear or pale yellow. Do not lift heavy objects. Do not stand for long periods of time. Quit smoking. This should keep you from developing a frequent cough. SEEK MEDICAL CARE IF:  A bulge develops in your  groin area. You feel pain, a burning sensation or pressure in the groin. This might be worse if you are lifting or straining. You develop a fever of more than 100.5 F (38.1 C). SEEK IMMEDIATE MEDICAL CARE IF:  Pain in the groin increases suddenly. A bulge in the groin gets bigger suddenly and does not go down. For men, there is sudden pain in the scrotum. Or, the size of the scrotum increases. A bulge in the groin area becomes red or purple and is painful to touch. You have nausea or vomiting that does not go away. You feel your heart beating much faster than normal. You cannot have a bowel movement or pass gas. You develop a fever of more than 102.0 F (38.9 C).   This information is not intended to replace advice given to you by your health care provider. Make sure you discuss any questions you have with your health care provider.   Document Released: 11/09/2008 Document Revised: 09/15/2011 Document Reviewed: 12/25/2014 Elsevier Interactive Patient Education [employer]

## 2023-12-10 ENCOUNTER — Telehealth: Payer: Self-pay | Admitting: Surgery

## 2023-12-10 NOTE — Telephone Encounter (Signed)
 Patient has been advised of Pre-Admission date/time, and Surgery date at [facility].  Surgery Date: 12/17/23 Preadmission Testing Date: 12/15/23 (phone 1p-4p)  Patient informed of the scheduling process and surgery information given at time of office visit.   Patient has been made aware to call [phone], between 1-3:00pm the day before surgery, to find out what time to arrive for surgery.

## 2023-12-10 NOTE — Progress Notes (Unsigned)
 Patient ID: Dean Morton, male   DOB: December 28, 1970, 53 y.o.   MRN: [id number]  HPI Dean Morton is a 53 y.o. male seen in consultation at the request of Dr. Romeo Co.  Patient endorses right lower quadrant and inguinal pain.  He also endorses that there are certain movements that aggravate the pain.  The pain is mild to moderate seems to be sharp and worsening with certain movements.  No specific alleviating factors.  Did have a prior history of umbilical hernia repair.  He is able to perform more than 4 METS of activity without any shortness of breath or chest pain.  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 He did have recent lab including a hemoglobin A1c CBC and CMP that was all completely normal. Did have a CT scan of the chest that have personally reviewed showing no acute thoracic abnormalities.  As the image does not reach the pelvic area. Of note he was complaining of some left-sided chest wall discomfort and I examined the CT scan and did not find any explanation for the symptoms.  HPI  Past Medical History:  Diagnosis Date   Allergy    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,000 +)   Anxiety    Hypertension    took BP meds with panic attacks   Pseudofolliculitis barbae     Past Surgical History:  Procedure Laterality Date   UMBILICAL HERNIA REPAIR      Family History  Problem Relation Age of Onset   Hypertension Mother    Stroke Maternal Grandmother     Social History Social History   Tobacco Use   Smoking status: Never   Smokeless tobacco: Never  Substance Use Topics   Alcohol use: No   Drug use: No    Allergies  Allergen Reactions   Augmentin [Amoxicillin-Pot Clavulanate] Nausea Only    No current outpatient medications on file.   No current facility-administered medications for this visit.     Review of Systems Full ROS  was asked and was negative except for the information on  the HPI  Physical Exam Blood pressure (!) 137/91, pulse 77, temperature 98.9 F (37.2 C), temperature source Oral, height 6\' 1"  (1.854 m), weight 268 lb (121.6 kg), SpO2 98%. CONSTITUTIONAL: NAD. EYES: Pupils are equal, round,  Sclera are non-icteric. EARS, NOSE, MOUTH AND THROAT: The oropharynx is clear. The oral mucosa is pink and moist. Hearing is intact to voice. LYMPH NODES:  Lymph nodes in the neck are normal. RESPIRATORY:  Lungs are clear. There is normal respiratory effort, with equal breath sounds bilaterally, and without pathologic use of accessory muscles. CARDIOVASCULAR: Heart is regular without murmurs, gallops, or rubs. GI: The abdomen is  soft, nontender, and nondistended. There are no palpable masses. There is no hepatosplenomegaly. There are normal bowel sounds .  Evidence of a right inguinal hernia that is reducible.  Mildly tender to palpation.  Evidence of prior umbilical scar GU: Rectal deferred.   MUSCULOSKELETAL: Normal muscle strength and tone. No cyanosis or edema.   SKIN: Turgor is good and there are no pathologic skin lesions or ulcers. NEUROLOGIC: Motor and sensation is grossly normal. Cranial nerves are grossly intact. PSYCH:  Oriented to person, place and time. Affect is normal.  Data Reviewed  I have personally reviewed the patient's imaging, laboratory findings and medical records.    Assessment/Plan 53 year old very pleasant male with a symptomatic right  inguinal hernia.  Discussed with the patient and his wife about his disease process.  Given that he has symptoms I do recommend repair.  Different modalities of open versus robotic approach were discussed.  I do think that robotic approach will be a very good fit for him.  DisCussed with him what this procedure will entail the risk the benefits and the possible complications including but not limited to: Bleeding, infection, intra-abdominal injuries, recurrence and chronic pain.  He understands and wishes to  proceed.  He wishes to take care of this as soon as possible as he does have significant activities and work for the summer and he wants to be recovered. Also discussed with him that if we were to encounter bilateral defects that I will suggest fixing them at the same time.  He is in agreement We also talked about postoperative recovery restrictions and limitations including lifting restrictions.  Also discussed at length his work and what things he should avoid. Please note that I spent 55 minutes in the encounter including  coordinating his care, placing orders, counseling pt and family and performing appropriate documentation. A copy of this report was sent to the referring provider   Evelia Hipp, MD FACS General Surgeon 12/10/2023, 4:02 PM

## 2023-12-10 NOTE — H&P (View-Only) (Signed)
 Patient ID: Dean Morton, male   DOB: [birth year], 53 y.o.   MRN: [id number]  HPI Dean Morton is a 53 y.o. male seen in consultation at the request of Dr. Romeo Co.  Patient endorses right lower quadrant and inguinal pain.  He also endorses that there are certain movements that aggravate the pain.  The pain is mild to moderate seems to be sharp and worsening with certain movements.  No specific alleviating factors.  Did have a prior history of umbilical hernia repair.  He is able to perform more than 4 METS of activity without any shortness of breath or chest pain.  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 He did have recent lab including a hemoglobin A1c CBC and CMP that was all completely normal. Did have a CT scan of the chest that have personally reviewed showing no acute thoracic abnormalities.  As the image does not reach the pelvic area. Of note he was complaining of some left-sided chest wall discomfort and I examined the CT scan and did not find any explanation for the symptoms.  HPI  Past Medical History:  Diagnosis Date   Allergy    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,000 +)   Anxiety    Hypertension    took BP meds with panic attacks   Pseudofolliculitis barbae     Past Surgical History:  Procedure Laterality Date   UMBILICAL HERNIA REPAIR      Family History  Problem Relation Age of Onset   Hypertension Mother    Stroke Maternal Grandmother     Social History Social History   Tobacco Use   Smoking status: Never   Smokeless tobacco: Never  Substance Use Topics   Alcohol use: No   Drug use: No    Allergies  Allergen Reactions   Augmentin [Amoxicillin-Pot Clavulanate] Nausea Only    No current outpatient medications on file.   No current facility-administered medications for this visit.     Review of Systems Full ROS  was asked and was negative except for the information on  the HPI  Physical Exam Blood pressure (!) 137/91, pulse 77, temperature 98.9 F (37.2 C), temperature source Oral, height 6' 1 (1.854 m), weight 268 lb (121.6 kg), SpO2 98%. CONSTITUTIONAL: NAD. EYES: Pupils are equal, round,  Sclera are non-icteric. EARS, NOSE, MOUTH AND THROAT: The oropharynx is clear. The oral mucosa is pink and moist. Hearing is intact to voice. LYMPH NODES:  Lymph nodes in the neck are normal. RESPIRATORY:  Lungs are clear. There is normal respiratory effort, with equal breath sounds bilaterally, and without pathologic use of accessory muscles. CARDIOVASCULAR: Heart is regular without murmurs, gallops, or rubs. GI: The abdomen is  soft, nontender, and nondistended. There are no palpable masses. There is no hepatosplenomegaly. There are normal bowel sounds .  Evidence of a right inguinal hernia that is reducible.  Mildly tender to palpation.  Evidence of prior umbilical scar GU: Rectal deferred.   MUSCULOSKELETAL: Normal muscle strength and tone. No cyanosis or edema.   SKIN: Turgor is good and there are no pathologic skin lesions or ulcers. NEUROLOGIC: Motor and sensation is grossly normal. Cranial nerves are grossly intact. PSYCH:  Oriented to person, place and time. Affect is normal.  Data Reviewed  I have personally reviewed the patient's imaging, laboratory findings and medical records.    Assessment/Plan 53 year old very pleasant male with a symptomatic right  inguinal hernia.  Discussed with the patient and his wife about his disease process.  Given that he has symptoms I do recommend repair.  Different modalities of open versus robotic approach were discussed.  I do think that robotic approach will be a very good fit for him.  DisCussed with him what this procedure will entail the risk the benefits and the possible complications including but not limited to: Bleeding, infection, intra-abdominal injuries, recurrence and chronic pain.  He understands and wishes to  proceed.  He wishes to take care of this as soon as possible as he does have significant activities and work for the summer and he wants to be recovered. Also discussed with him that if we were to encounter bilateral defects that I will suggest fixing them at the same time.  He is in agreement We also talked about postoperative recovery restrictions and limitations including lifting restrictions.  Also discussed at length his work and what things he should avoid. Please note that I spent 55 minutes in the encounter including  coordinating his care, placing orders, counseling pt and family and performing appropriate documentation. A copy of this report was sent to the referring provider   Evelia Hipp, MD FACS General Surgeon 12/10/2023, 4:02 PM

## 2023-12-11 ENCOUNTER — Encounter: Payer: Self-pay | Admitting: Surgery

## 2023-12-15 ENCOUNTER — Other Ambulatory Visit: Payer: Self-pay

## 2023-12-15 ENCOUNTER — Encounter
Admission: RE | Admit: 2023-12-15 | Discharge: 2023-12-15 | Disposition: A | Discharge status: Home / Self Care | Payer: Self-pay | Source: Ambulatory Visit | Attending: Surgery | Admitting: Surgery

## 2023-12-15 VITALS — Ht 73.0 in | Wt 268.0 lb

## 2023-12-15 DIAGNOSIS — Z01810 Encounter for preprocedural cardiovascular examination: Secondary | ICD-10-CM

## 2023-12-15 DIAGNOSIS — I10 Essential (primary) hypertension: Secondary | ICD-10-CM

## 2023-12-15 DIAGNOSIS — Z01812 Encounter for preprocedural laboratory examination: Secondary | ICD-10-CM

## 2023-12-15 HISTORY — DX: Essential (primary) hypertension: I10

## 2023-12-15 HISTORY — DX: Gastro-esophageal reflux disease without esophagitis: K21.9

## 2023-12-15 HISTORY — DX: Helicobacter pylori (H. pylori) as the cause of diseases classified elsewhere: B96.81

## 2023-12-15 HISTORY — DX: Obesity, unspecified: E66.9

## 2023-12-15 HISTORY — DX: Benign prostatic hyperplasia with lower urinary tract symptoms: N40.1

## 2023-12-15 HISTORY — DX: Umbilical hernia without obstruction or gangrene: K42.9

## 2023-12-15 NOTE — Patient Instructions (Addendum)
 Your procedure is scheduled on: Thursday, June 12 Report to the Registration Desk on the 1st floor of the [facility] To find out your arrival time, please call [phone] between 1PM - 3PM on: Wednesday, June 11 If your arrival time is 6:00 am, do not arrive before that time as the [facility] entrance doors do not open until 6:00 am.  REMEMBER: Instructions that are not followed completely may result in serious medical risk, up to and including death; or upon the discretion of your surgeon and anesthesiologist your surgery may need to be rescheduled.  Do not eat food after midnight the night before surgery.  No gum chewing or hard candies.  You may however, drink CLEAR liquids up to 2 hours before you are scheduled to arrive for your surgery. Do not drink anything within 2 hours of your scheduled arrival time.  Clear liquids include: - water  - apple juice without pulp - gatorade (not RED colors) - black coffee or tea (Do NOT add milk or creamers to the coffee or tea) Do NOT drink anything that is not on this list.  One week prior to surgery: Stop Anti-inflammatories (NSAIDS) such as Advil, Aleve, Ibuprofen, Motrin, Naproxen, Naprosyn and Aspirin based products such as Excedrin, Goody's Powder, BC Powder. Stop ANY OVER THE COUNTER supplements until after surgery.  You may however, continue to take Tylenol if needed for pain up until the day of surgery.  ON THE DAY OF SURGERY DO NOT TAKE ANY MEDICATIONS   No Alcohol for 24 hours before or after surgery.  No Smoking including e-cigarettes for 24 hours before surgery.  No chewable tobacco products for at least 6 hours before surgery.  No nicotine patches on the day of surgery.  Do not use any "recreational" drugs for at least a week (preferably 2 weeks) before your surgery.  Please be advised that the combination of cocaine and anesthesia may have negative outcomes, up to and including death. If you test positive for  cocaine, your surgery will be cancelled.  On the morning of surgery brush your teeth with toothpaste and water, you may rinse your mouth with mouthwash if you wish. Do not swallow any toothpaste or mouthwash.  Use CHG Soap as directed on instruction sheet.  Do not wear jewelry, make-up, hairpins, clips or nail polish.  For welded (permanent) jewelry: bracelets, anklets, waist bands, etc.  Please have this removed prior to surgery.  If it is not removed, there is a chance that hospital personnel will need to cut it off on the day of surgery.  Do not wear lotions, powders, or perfumes.   Do not shave body hair from the neck down 48 hours before surgery.  Contact lenses, hearing aids and dentures may not be worn into surgery.  Do not bring valuables to the hospital. [facility] is not responsible for any missing/lost belongings or valuables.   Notify your doctor if there is any change in your medical condition (cold, fever, infection).  Wear comfortable clothing (specific to your surgery type) to the hospital.  After surgery, you can help prevent lung complications by doing breathing exercises.  Take deep breaths and cough every 1-2 hours. Your doctor may order a device called an Incentive Spirometer to help you take deep breaths. When coughing or sneezing, hold a pillow firmly against your incision with both hands. This is called "splinting." Doing this helps protect your incision. It also decreases belly discomfort.  If you are being discharged the  day of surgery, you will not be allowed to drive home. You will need a responsible individual to drive you home and stay with you for 24 hours after surgery.   If you are taking public transportation, you will need to have a responsible individual with you.  Please call the Pre-admissions Testing Dept. at [phone] if you have any questions about these instructions.  Surgery Visitation Policy:  Patients having surgery or a procedure  may have two visitors.  Children under the age of 79 must have an adult with them who is not the patient.      Preparing for Surgery with CHLORHEXIDINE GLUCONATE (CHG) Soap  Chlorhexidine Gluconate (CHG) Soap  o An antiseptic cleaner that kills germs and bonds with the skin to continue killing germs even after washing  o Used for showering the night before surgery and morning of surgery  Before surgery, you can play an important role by reducing the number of germs on your skin.  CHG (Chlorhexidine gluconate) soap is an antiseptic cleanser which kills germs and bonds with the skin to continue killing germs even after washing.  Please do not use if you have an allergy to CHG or antibacterial soaps. If your skin becomes reddened/irritated stop using the CHG.  1. Shower the NIGHT BEFORE SURGERY and the MORNING OF SURGERY with CHG soap.  2. If you choose to wash your hair, wash your hair first as usual with your normal shampoo.  3. After shampooing, rinse your hair and body thoroughly to remove the shampoo.  4. Use CHG as you would any other liquid soap. You can apply CHG directly to the skin and wash gently with a scrungie or a clean washcloth.  5. Apply the CHG soap to your body only from the neck down. Do not use on open wounds or open sores. Avoid contact with your eyes, ears, mouth, and genitals (private parts). Wash face and genitals (private parts) with your normal soap.  6. Wash thoroughly, paying special attention to the area where your surgery will be performed.  7. Thoroughly rinse your body with warm water.  8. Do not shower/wash with your normal soap after using and rinsing off the CHG soap.  9. Pat yourself dry with a clean towel.  10. Wear clean pajamas to bed the night before surgery.  12. Place clean sheets on your bed the night of your first shower and do not sleep with pets.  13. Shower again with the CHG soap on the day of surgery prior to arriving at the  hospital.  14. Do not apply any deodorants/lotions/powders.  15. Please wear clean clothes to the hospital.

## 2023-12-16 ENCOUNTER — Encounter
Admission: RE | Admit: 2023-12-16 | Discharge: 2023-12-16 | Disposition: A | Discharge status: Home / Self Care | Payer: Self-pay | Source: Ambulatory Visit | Attending: Surgery | Admitting: Surgery

## 2023-12-16 DIAGNOSIS — I10 Essential (primary) hypertension: Secondary | ICD-10-CM | POA: Insufficient documentation

## 2023-12-16 DIAGNOSIS — Z01810 Encounter for preprocedural cardiovascular examination: Secondary | ICD-10-CM

## 2023-12-16 DIAGNOSIS — Z01812 Encounter for preprocedural laboratory examination: Secondary | ICD-10-CM

## 2023-12-16 DIAGNOSIS — Z01818 Encounter for other preprocedural examination: Secondary | ICD-10-CM | POA: Insufficient documentation

## 2023-12-16 LAB — BASIC METABOLIC PANEL WITH GFR
Anion gap: 6 (ref 5–15)
BUN: 18 mg/dL (ref 6–20)
CO2: 27 mmol/L (ref 22–32)
Calcium: 9.1 mg/dL (ref 8.9–10.3)
Chloride: 106 mmol/L (ref 98–111)
Creatinine, Ser: 1.15 mg/dL (ref 0.61–1.24)
GFR, Estimated: >60 mL/min (ref >60)
Glucose, Bld: 82 mg/dL (ref 70–99)
Potassium: 3.7 mmol/L (ref 3.5–5.1)
Sodium: 139 mmol/L (ref 135–145)

## 2023-12-16 LAB — CBC
HCT: 42.6 % (ref 39.0–52.0)
Hemoglobin: 14.7 g/dL (ref 13.0–17.0)
MCH: 29.5 pg (ref 26.0–34.0)
MCHC: 34.5 g/dL (ref 30.0–36.0)
MCV: 85.4 fL (ref 80.0–100.0)
Platelets: 208 10*3/uL (ref 150–400)
RBC: 4.99 MIL/uL (ref 4.22–5.81)
RDW: 12.4 % (ref 11.5–15.5)
WBC: 3.9 10*3/uL — ABNORMAL LOW (ref 4.0–10.5)
nRBC: 0 % (ref 0.0–0.2)

## 2023-12-17 ENCOUNTER — Other Ambulatory Visit: Payer: Self-pay

## 2023-12-17 ENCOUNTER — Encounter: Payer: Self-pay | Admitting: Surgery

## 2023-12-17 ENCOUNTER — Encounter
Admission: RE | Disposition: A | Discharge status: Home / Self Care | Payer: Self-pay | Source: Home / Self Care | Attending: Surgery

## 2023-12-17 ENCOUNTER — Ambulatory Visit: Payer: Self-pay | Admitting: Urgent Care

## 2023-12-17 ENCOUNTER — Ambulatory Visit
Admission: RE | Admit: 2023-12-17 | Discharge: 2023-12-17 | Disposition: A | Discharge status: Home / Self Care | Payer: Self-pay | Attending: Surgery | Admitting: Surgery

## 2023-12-17 ENCOUNTER — Ambulatory Visit: Payer: Self-pay | Admitting: Certified Registered"

## 2023-12-17 DIAGNOSIS — I10 Essential (primary) hypertension: Secondary | ICD-10-CM | POA: Insufficient documentation

## 2023-12-17 DIAGNOSIS — K4090 Unilateral inguinal hernia, without obstruction or gangrene, not specified as recurrent: Secondary | ICD-10-CM | POA: Insufficient documentation

## 2023-12-17 DIAGNOSIS — K4091 Unilateral inguinal hernia, without obstruction or gangrene, recurrent: Secondary | ICD-10-CM

## 2023-12-17 HISTORY — PX: HERNIORRHAPHY, INGUINAL, ROBOT-ASSISTED, LAPAROSCOPIC: SHX7585

## 2023-12-17 HISTORY — PX: INSERTION OF MESH: SHX5868

## 2023-12-17 SURGERY — HERNIORRHAPHY, INGUINAL, ROBOT-ASSISTED, LAPAROSCOPIC
Anesthesia: General | Laterality: Right

## 2023-12-17 MED ORDER — FENTANYL CITRATE (PF) 100 MCG/2ML IJ SOLN
25.0000 ug | INTRAMUSCULAR | Status: DC | PRN
  Administered 2023-12-17 (×3): 50 ug via INTRAVENOUS

## 2023-12-17 MED ORDER — PROPOFOL 10 MG/ML IV BOLUS
INTRAVENOUS | Status: DC | PRN
  Administered 2023-12-17: 100 mg via INTRAVENOUS
  Administered 2023-12-17: 200 mg via INTRAVENOUS

## 2023-12-17 MED ORDER — LACTATED RINGERS IV SOLN: INTRAVENOUS | Status: DC

## 2023-12-17 MED ORDER — FENTANYL CITRATE (PF) 100 MCG/2ML IJ SOLN
INTRAMUSCULAR | Status: AC
Start: 2023-12-17 — End: 2023-12-17
  Filled 2023-12-17: qty 2

## 2023-12-17 MED ORDER — LIDOCAINE HCL (PF) 2 % IJ SOLN
INTRAMUSCULAR | Status: DC | PRN
  Administered 2023-12-17: 80 mg via INTRADERMAL

## 2023-12-17 MED ORDER — OXYCODONE HCL 5 MG PO TABS
ORAL_TABLET | ORAL | Status: AC
  Filled 2023-12-17: qty 1

## 2023-12-17 MED ORDER — FENTANYL CITRATE (PF) 100 MCG/2ML IJ SOLN
INTRAMUSCULAR | Status: DC | PRN
  Administered 2023-12-17 (×4): 50 ug via INTRAVENOUS

## 2023-12-17 MED ORDER — SUGAMMADEX SODIUM 200 MG/2ML IV SOLN
INTRAVENOUS | Status: DC | PRN
  Administered 2023-12-17: 400 mg via INTRAVENOUS

## 2023-12-17 MED ORDER — FENTANYL CITRATE (PF) 100 MCG/2ML IJ SOLN
INTRAMUSCULAR | Status: AC
  Filled 2023-12-17: qty 2

## 2023-12-17 MED ORDER — CEFAZOLIN SODIUM-DEXTROSE 2-4 GM/100ML-% IV SOLN
INTRAVENOUS | Status: AC
  Filled 2023-12-17: qty 100

## 2023-12-17 MED ORDER — OXYCODONE HCL 5 MG/5ML PO SOLN: 5.0000 mg | Freq: Once | ORAL | Status: AC | PRN

## 2023-12-17 MED ORDER — ROCURONIUM BROMIDE 10 MG/ML (PF) SYRINGE
PREFILLED_SYRINGE | INTRAVENOUS | Status: DC | PRN
  Administered 2023-12-17: 60 mg via INTRAVENOUS
  Administered 2023-12-17: 20 mg via INTRAVENOUS
  Administered 2023-12-17: 50 mg via INTRAVENOUS

## 2023-12-17 MED ORDER — MIDAZOLAM HCL 2 MG/2ML IJ SOLN
INTRAMUSCULAR | Status: AC
  Filled 2023-12-17: qty 2

## 2023-12-17 MED ORDER — CHLORHEXIDINE GLUCONATE CLOTH 2 % EX PADS
6.0000 | MEDICATED_PAD | Freq: Once | CUTANEOUS | Status: AC
  Administered 2023-12-17: 6 via TOPICAL

## 2023-12-17 MED ORDER — PROPOFOL 10 MG/ML IV BOLUS
INTRAVENOUS | Status: AC
  Filled 2023-12-17: qty 40

## 2023-12-17 MED ORDER — CEFAZOLIN SODIUM-DEXTROSE 3-4 GM/150ML-% IV SOLN
3.0000 g | INTRAVENOUS | Status: AC
  Administered 2023-12-17: 2 g via INTRAVENOUS
  Filled 2023-12-17: qty 150

## 2023-12-17 MED ORDER — BUPIVACAINE-EPINEPHRINE (PF) 0.25% -1:200000 IJ SOLN
INTRAMUSCULAR | Status: DC | PRN
  Administered 2023-12-17: 50 mL via INTRAMUSCULAR

## 2023-12-17 MED ORDER — ORAL CARE MOUTH RINSE: 15.0000 mL | Freq: Once | OROMUCOSAL | Status: AC

## 2023-12-17 MED ORDER — DEXAMETHASONE SODIUM PHOSPHATE 10 MG/ML IJ SOLN
INTRAMUSCULAR | Status: DC | PRN
  Administered 2023-12-17: 5 mg via INTRAVENOUS

## 2023-12-17 MED ORDER — BUPIVACAINE-EPINEPHRINE (PF) 0.25% -1:200000 IJ SOLN
INTRAMUSCULAR | Status: AC
  Filled 2023-12-17: qty 30

## 2023-12-17 MED ORDER — CHLORHEXIDINE GLUCONATE 0.12 % MT SOLN
15.0000 mL | Freq: Once | OROMUCOSAL | Status: AC
  Administered 2023-12-17: 15 mL via OROMUCOSAL

## 2023-12-17 MED ORDER — BUPIVACAINE LIPOSOME 1.3 % IJ SUSP
INTRAMUSCULAR | Status: AC
  Filled 2023-12-17: qty 20

## 2023-12-17 MED ORDER — CHLORHEXIDINE GLUCONATE 0.12 % MT SOLN
OROMUCOSAL | Status: AC
  Filled 2023-12-17: qty 15

## 2023-12-17 MED ORDER — OXYCODONE HCL 5 MG PO TABS
5.0000 mg | ORAL_TABLET | Freq: Once | ORAL | Status: AC | PRN
  Administered 2023-12-17: 5 mg via ORAL

## 2023-12-17 MED ORDER — PHENYLEPHRINE 80 MCG/ML (10ML) SYRINGE FOR IV PUSH (FOR BLOOD PRESSURE SUPPORT)
PREFILLED_SYRINGE | INTRAVENOUS | Status: DC | PRN
  Administered 2023-12-17: 80 ug via INTRAVENOUS

## 2023-12-17 MED ORDER — HYDROCODONE-ACETAMINOPHEN 5-325 MG PO TABS: 1.0000 | ORAL_TABLET | ORAL | 0 refills | Status: DC | PRN

## 2023-12-17 MED ORDER — ONDANSETRON HCL 4 MG/2ML IJ SOLN
INTRAMUSCULAR | Status: DC | PRN
  Administered 2023-12-17: 4 mg via INTRAVENOUS

## 2023-12-17 SURGICAL SUPPLY — 36 items
CANNULA REDUCER 12-8 DVNC XI (CANNULA) ×3 IMPLANT
COVER TIP SHEARS 8 DVNC (MISCELLANEOUS) ×3 IMPLANT
COVER WAND RF STERILE (DRAPES) ×3 IMPLANT
DERMABOND ADVANCED .7 DNX12 (GAUZE/BANDAGES/DRESSINGS) ×3 IMPLANT
DRAPE ARM DVNC X/XI (DISPOSABLE) ×9 IMPLANT
DRAPE COLUMN DVNC XI (DISPOSABLE) ×3 IMPLANT
ELECTRODE REM PT RTRN 9FT ADLT (ELECTROSURGICAL) ×3 IMPLANT
FORCEPS BPLR R/ABLATION 8 DVNC (INSTRUMENTS) ×3 IMPLANT
FORCEPS PROGRASP DVNC XI (FORCEP) ×3 IMPLANT
GLOVE BIO SURGEON STRL SZ7 (GLOVE) ×12 IMPLANT
GOWN STRL REUS W/ TWL LRG LVL3 (GOWN DISPOSABLE) ×12 IMPLANT
IRRIGATION STRYKERFLOW (MISCELLANEOUS) ×3 IMPLANT
IV NS 1000ML BAXH (IV SOLUTION) IMPLANT
KIT PINK PAD W/HEAD ARE REST (MISCELLANEOUS) ×2 IMPLANT
KIT PINK PAD W/HEAD ARM REST (MISCELLANEOUS) ×3 IMPLANT
LABEL OR SOLS (LABEL) ×3 IMPLANT
MANIFOLD NEPTUNE II (INSTRUMENTS) ×3 IMPLANT
MESH 3DMAX 4X6 RT LRG (Mesh General) IMPLANT
NDL DRIVE SUT CUT DVNC (INSTRUMENTS) ×3 IMPLANT
NDL HYPO 22X1.5 SAFETY MO (MISCELLANEOUS) ×3 IMPLANT
NEEDLE DRIVE SUT CUT DVNC (INSTRUMENTS) ×2 IMPLANT
NEEDLE HYPO 22X1.5 SAFETY MO (MISCELLANEOUS) ×2 IMPLANT
OBTURATOR OPTICALSTD 8 DVNC (TROCAR) ×3 IMPLANT
PACK LAP CHOLECYSTECTOMY (MISCELLANEOUS) ×3 IMPLANT
SCISSORS MNPLR CVD DVNC XI (INSTRUMENTS) ×3 IMPLANT
SEAL UNIV 5-12 XI (MISCELLANEOUS) ×6 IMPLANT
SET TUBE SMOKE EVAC HIGH FLOW (TUBING) ×3 IMPLANT
SOLUTION ELECTROSURG ANTI STCK (MISCELLANEOUS) ×3 IMPLANT
SPONGE T-LAP 18X18 ~~LOC~~+RFID (SPONGE) ×3 IMPLANT
SUT MNCRL AB 4-0 PS2 18 (SUTURE) ×3 IMPLANT
SUT STRATA 3-0 SH (SUTURE) ×6 IMPLANT
SUT VICRYL 0 UR6 27IN ABS (SUTURE) ×6 IMPLANT
SYR 30ML LL (SYRINGE) ×3 IMPLANT
TAPE TRANSPORE STRL 2 31045 (GAUZE/BANDAGES/DRESSINGS) ×3 IMPLANT
TRAP FLUID SMOKE EVACUATOR (MISCELLANEOUS) ×3 IMPLANT
WATER STERILE IRR 500ML POUR (IV SOLUTION) ×3 IMPLANT

## 2023-12-17 NOTE — Anesthesia Postprocedure Evaluation (Signed)
 Anesthesia Post Note  Patient: Dean Morton  Procedure(s) Performed: HERNIORRHAPHY, INGUINAL, ROBOT-ASSISTED, LAPAROSCOPIC (Right) INSERTION OF MESH  Patient location during evaluation: PACU Anesthesia Type: General Level of consciousness: awake and alert Pain management: pain level controlled Vital Signs Assessment: post-procedure vital signs reviewed and stable Respiratory status: spontaneous breathing, nonlabored ventilation, respiratory function stable and patient connected to nasal cannula oxygen Cardiovascular status: blood pressure returned to baseline and stable Postop Assessment: no apparent nausea or vomiting Anesthetic complications: no  No notable events documented.   Last Vitals:  Vitals:   12/17/23 1200 12/17/23 1212  BP: (!) 143/87 128/87  Pulse: 78   Resp: 18   Temp:  36.9 C  SpO2: 100% 94%    Last Pain:  Vitals:   12/17/23 1212  TempSrc:   PainSc: 4                  Enrique Harvest

## 2023-12-17 NOTE — Anesthesia Preprocedure Evaluation (Addendum)
 Anesthesia Evaluation  Patient identified by MRN, date of birth, ID band Patient awake    Reviewed: Allergy & Precautions, NPO status , Patient's Chart, lab work & pertinent test results  Airway Mallampati: III  TM Distance: >3 FB Neck ROM: full    Dental  (+) Chipped   Pulmonary neg pulmonary ROS   Pulmonary exam normal        Cardiovascular hypertension, negative cardio ROS Normal cardiovascular exam     Neuro/Psych  PSYCHIATRIC DISORDERS Anxiety     negative neurological ROS     GI/Hepatic Neg liver ROS,GERD  ,,  Endo/Other  negative endocrine ROS    Renal/GU      Musculoskeletal   Abdominal   Peds  Hematology negative hematology ROS (+)   Anesthesia Other Findings Past Medical History: No date: Allergy     Comment:  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,000 +) No date: Anxiety No date: Benign prostatic hyperplasia with weak urinary stream No date: Essential hypertension No date: GERD (gastroesophageal reflux disease) No date: Helicobacter pylori gastritis No date: Hypertension     Comment:  took BP meds with panic attacks 12/2023: Inguinal hernia No date: Obesity No date: Pseudofolliculitis barbae No date: Umbilical hernia  Past Surgical History: No date: UMBILICAL HERNIA REPAIR     Comment:  teenager  BMI    Body Mass Index: 35.49 kg/m      Reproductive/Obstetrics negative OB ROS                             Anesthesia Physical Anesthesia Plan  ASA: 2  Anesthesia Plan: General ETT   Post-op Pain Management:    Induction: Intravenous  PONV Risk Score and Plan: 3 and Ondansetron, Dexamethasone and Midazolam  Airway Management Planned: Oral ETT  Additional Equipment:   Intra-op Plan:   Post-operative Plan: Extubation in OR  Informed Consent: I have reviewed the patients History and Physical, chart, labs and discussed the procedure including the risks,  benefits and alternatives for the proposed anesthesia with the patient or authorized representative who has indicated his/her understanding and acceptance.     Dental Advisory Given  Plan Discussed with: Anesthesiologist, CRNA and Surgeon  Anesthesia Plan Comments: (Patient consented for risks of anesthesia including but not limited to:  - adverse reactions to medications - damage to eyes, teeth, lips or other oral mucosa - nerve damage due to positioning  - sore throat or hoarseness - Damage to heart, brain, nerves, lungs, other parts of body or loss of life  Patient voiced understanding and assent.)       Anesthesia Quick Evaluation

## 2023-12-17 NOTE — Transfer of Care (Signed)
 Immediate Anesthesia Transfer of Care Note  Patient: Dean Morton  Procedure(s) Performed: HERNIORRHAPHY, INGUINAL, ROBOT-ASSISTED, LAPAROSCOPIC (Right) INSERTION OF MESH  Patient Location: PACU  Anesthesia Type:General  Level of Consciousness: awake and alert   Airway & Oxygen Therapy: Patient Spontanous Breathing and Patient connected to face mask oxygen  Post-op Assessment: Report given to RN and Post -op Vital signs reviewed and stable  Post vital signs: Reviewed and stable  Last Vitals: Some pain txd and pt states just a little sore now.   Vitals Value Taken Time  BP 139/81 12/17/23 10:56  Temp 36.2 C 12/17/23 10:54  Pulse 90 12/17/23 10:57  Resp 18 12/17/23 10:57  SpO2 100 % 12/17/23 10:57  Vitals shown include unfiled device data.  Last Pain:  Vitals:   12/17/23 0813  TempSrc: Oral  PainSc: 0-No pain         Complications: No notable events documented.

## 2023-12-17 NOTE — Op Note (Signed)
 Robotic assisted Laparoscopic Transabdominal Right Inguinal Hernia Repair with 3 D large Mesh       Pre-operative Diagnosis:  Right Inguinal Hernia   Post-operative Diagnosis: Same   Procedure: Robotic  Laparoscopic  repair of Right inguinal hernia   Surgeon: Evelia Hipp, MD FACS   Anesthesia: Gen. with endotracheal tube   Findings: Right indirect inguinal hernia        Procedure Details  The patient was seen again in the Holding Room. The benefits, complications, treatment options, and expected outcomes were discussed with the patient. The risks of bleeding, infection, recurrence of symptoms, failure to resolve symptoms, recurrence of hernia, ischemic orchitis, chronic pain syndrome or neuroma, were discussed again. The likelihood of improving the patient's symptoms with return to their baseline status is good.  The patient and/or family concurred with the proposed plan, giving informed consent.  The patient was taken to Operating Room, identified  and the procedure verified as Laparoscopic Inguinal Hernia Repair. Laterality confirmed.  A Time Out was held and the above information confirmed.   Prior to the induction of general anesthesia, antibiotic prophylaxis was administered. VTE prophylaxis was in place. General endotracheal anesthesia was then administered and tolerated well. After the induction, the abdomen was prepped with Chloraprep and draped in the sterile fashion. The patient was positioned in the supine position.     Supraumbilical incision created and cut down technique used to enter the abdominal cavity. Fascia elevated and incised and hasson trochar placed. Pneumoperitoneum obtained w/o HD changes. No evidence of bowel injuries.  Two 8 mm placed under direct vision. The laparoscopy revealed indirect defect Right side. I inserted the needles and the mesh. The robot was brought ot the table and docked in the standard fashion, no collision between arms was observed. Instruments  were kept under direct view at all times. We started on the right side were a flap was created. The sac was reduced and dissected free from adjacent structures.Lipoma of the cord was also reduced and pushed posteriorly. We preserved the vas and the vessels. Once dissection was completed a large 3D mesh was placed and secured with two interrupted vicryl attached to the pubic tubercle. There was good coverage of the direct, indirect and femoral spaces. The flap was closed with v lock suture.  Second look revealed no complications or injuries.     Once assuring that hemostasis was adequate the ports were removed and a figure-of-eight 0 Vicryl suture was placed at the fascial edges. 4-0 subcuticular Monocryl was used at all skin edges. Dermabond was placed.  Patient tolerated the procedure well. There were no complications. He was taken to the recovery room in stable condition.                 Evelia Hipp, MD, FACS

## 2023-12-17 NOTE — Interval H&P Note (Signed)
 History and Physical Interval Note:  12/17/2023 8:42 AM  Dean Morton  has presented today for surgery, with the diagnosis of right inguinal hernia.  The various methods of treatment have been discussed with the patient and family. After consideration of risks, benefits and other options for treatment, the patient has consented to  Procedure(s): HERNIORRHAPHY, INGUINAL, ROBOT-ASSISTED, LAPAROSCOPIC (Right) as a surgical intervention.  The patient's history has been reviewed, patient examined, no change in status, stable for surgery.  I have reviewed the patient's chart and labs.  Questions were answered to the patient's satisfaction.     Shaleena Crusoe F Dezzie Badilla

## 2023-12-17 NOTE — Discharge Instructions (Signed)
 Laparoscopic Surgery for Groin Hernia in Adults: What to Know After After a laparoscopic surgery for groin hernia, it's common to have pain, discomfort, soreness, swelling, and bruising around the cuts that were made in the belly. There may also be swelling of the scrotum in males. Follow these instructions at home: Activity Rest as told. Get up and take short walks many times during the day. This helps you breathe better and keeps your blood flowing. Ask for help if you feel weak or unsteady. Ask if it's OK for you to lift. Do not take baths, swim, or use a hot tub until you're told it's OK. Ask if you can shower. Ask what things are safe for you to do at home. Ask when you can go back to work or school. Medicines Take your medicines only as told. You may need to take steps to help treat or prevent trouble pooping (constipation), such as: Taking medicine to help you poop. Eating foods high in fiber, like beans, whole grains, and fresh fruits and vegetables. Drinking more fluids as told. Ask your health care provider if it's safe to drive or use machines while taking your medicine. Wound care  Take care of the cuts in your belly as told. Make sure you: Wash your hands with soap and water for at least 20 seconds before and after you change your bandage. If you can't use soap and water, use hand sanitizer. Change your bandage. Leave stitches or skin glue alone. Leave tape strips alone unless you're told to take them off. You may trim the edges of the tape strips if they curl up. Check the cuts on your belly every day for signs of infection. Check for: More redness, swelling, or pain. More fluid or blood. Warmth. Pus or a bad smell. Pain management  Use ice or an ice pack as told. Place a towel between your skin and the ice. Leave the ice on for 20 minutes, 2-3 times a day. If your skin turns red, take off the ice right away to prevent skin damage. The risk of damage is higher if you  can't feel pain, heat, or cold. General instructions Do not smoke, vape, or use nicotine or tobacco. Doing this can slow healing. Wear compression stockings to reduce swelling and help prevent blood clots in your legs. You may be asked to continue to do deep breathing exercises at home. This will help to prevent a lung infection. Your provider may give you more instructions. Make sure you know what you can and can't do. Contact a health care provider if: You have any signs of infection. You have more swelling or pain in your scrotum. You have pain that gets worse or doesn't get better with medicine. You aren't able to pee. You haven't pooped in 3 days. You have a fever. You throw up or you feel like throwing up. Get help right away if: You have redness, warmth, or pain in your leg. You have chest pain. You have trouble breathing. You have very bad pain in your belly. You throw up each time you eat or drink. These symptoms may be an emergency. Call 911 right away. Do not wait to see if the symptoms will go away. Do not drive yourself to the hospital. This information is not intended to replace advice given to you by your health care provider. Make sure you discuss any questions you have with your health care provider. Document Revised: 04/07/2023 Document Reviewed: 04/07/2023 Elsevier Patient Education  2025  [employer].

## 2023-12-18 ENCOUNTER — Encounter: Payer: Self-pay | Admitting: Surgery

## 2023-12-25 ENCOUNTER — Encounter: Payer: Self-pay | Admitting: Internal Medicine

## 2023-12-25 ENCOUNTER — Telehealth: Payer: Self-pay | Admitting: Surgery

## 2023-12-25 NOTE — Telephone Encounter (Signed)
 Spoke with the patient about what to expect after surgery. His pain is controled and not having any excess pain. He is aware to call for any sharp increase in pain, spreading redness, or fever and chills.

## 2023-12-25 NOTE — Telephone Encounter (Signed)
 Pt had surgery and is having some pain on his abdomin . If he laughs hard or stands for a long period of time he feels the pain. He said is not red or hot to touch. Has a little pocket on the r side of the incision. Pt number is [phone]

## 2023-12-28 ENCOUNTER — Encounter: Payer: Self-pay | Admitting: Surgery

## 2023-12-28 ENCOUNTER — Ambulatory Visit (INDEPENDENT_AMBULATORY_CARE_PROVIDER_SITE_OTHER): Payer: Self-pay | Admitting: Surgery

## 2023-12-28 VITALS — BP 148/90 | HR 81 | Temp 98.6°F | Ht 73.0 in | Wt 273.0 lb

## 2023-12-28 DIAGNOSIS — K4090 Unilateral inguinal hernia, without obstruction or gangrene, not specified as recurrent: Secondary | ICD-10-CM

## 2023-12-28 DIAGNOSIS — K4091 Unilateral inguinal hernia, without obstruction or gangrene, recurrent: Secondary | ICD-10-CM

## 2023-12-28 DIAGNOSIS — T8131XA Disruption of external operation (surgical) wound, not elsewhere classified, initial encounter: Secondary | ICD-10-CM

## 2023-12-28 DIAGNOSIS — Z09 Encounter for follow-up examination after completed treatment for conditions other than malignant neoplasm: Secondary | ICD-10-CM

## 2023-12-28 NOTE — Patient Instructions (Addendum)
 Change your gauze dressing daily and keep it dry, if you get it wet from sweating you may want to change the dressing again to keep everything dry.    GENERAL POST-OPERATIVE PATIENT INSTRUCTIONS   WOUND CARE INSTRUCTIONS:  Keep a dry clean dressing on the wound if there is drainage. The initial bandage may be removed after 24 hours.  Once the wound has quit draining you may leave it open to air.  If clothing rubs against the wound or causes irritation and the wound is not draining you may cover it with a dry dressing during the daytime.  Try to keep the wound dry and avoid ointments on the wound unless directed to do so.  If the wound becomes bright red and painful or starts to drain infected material that is not clear, please contact your physician immediately.  If the wound is mildly pink and has a thick firm ridge underneath it, this is normal, and is referred to as a healing ridge.  This will resolve over the next 4-6 weeks.  BATHING: You may shower if you have been informed of this by your surgeon. However, Please do not submerge in a tub, hot tub, or pool until incisions are completely sealed or have been told by your surgeon that you may do so.  DIET:  You may eat any foods that you can tolerate.  It is a good idea to eat a high fiber diet and take in plenty of fluids to prevent constipation.  If you do become constipated you may want to take a mild laxative or take ducolax tablets on a daily basis until your bowel habits are regular.  Constipation can be very uncomfortable, along with straining, after recent surgery.  ACTIVITY:  You are encouraged to cough and deep breath or use your incentive spirometer if you were given one, every 15-30 minutes when awake.  This will help prevent respiratory complications and low grade fevers post-operatively if you had a general anesthetic.  You may want to hug a pillow when coughing and sneezing to add additional support to the surgical area, if you had  abdominal or chest surgery, which will decrease pain during these times.  You are encouraged to walk and engage in light activity for the next two weeks.  You should not lift more than 20 pounds for 6 weeks total after surgery as it could put you at increased risk for complications.  Twenty pounds is roughly equivalent to a plastic bag of groceries. At that time- Listen to your body when lifting, if you have pain when lifting, stop and then try again in a few days. Soreness after doing exercises or activities of daily living is normal as you get back in to your normal routine.  MEDICATIONS:  Try to take narcotic medications and anti-inflammatory medications, such as tylenol , ibuprofen, naprosyn, etc., with food.  This will minimize stomach upset from the medication.  Should you develop nausea and vomiting from the pain medication, or develop a rash, please discontinue the medication and contact your physician.  You should not drive, make important decisions, or operate machinery when taking narcotic pain medication.  SUNBLOCK Use sun block to incision area over the next year if this area will be exposed to sun. This helps decrease scarring and will allow you avoid a permanent darkened area over your incision.  QUESTIONS:  Please feel free to call our office if you have any questions, and we will be glad to assist you. [phone]

## 2023-12-28 NOTE — Progress Notes (Signed)
 12/28/2023  HPI: Dean Morton is a 53 y.o. male s/p robotic assisted right inguinal hernia repair with Dr. Jordis on 12/17/23.  Patient called over the weekend because of drainage from his midline port site.  He reports having some bloody drainage and possibly purulent fluid on 12/26/23, but this resolved.  Denies any worsening pain at the incision.  He does report some right lower quadrant discomfort and sensitivity at the right groin.  Vital signs: BP (!) 148/90   Pulse 81   Temp 98.6 F (37 C) (Oral)   Ht 6' 1 (1.854 m)   Wt 273 lb (123.8 kg)   SpO2 98%   BMI 36.02 kg/m    Physical Exam: Constitutional:  No acute distress Abdomen:  soft, obese, non-distended.  The midline port site has a scab from prior bloody drainage. This was lifted to reveal a superficial dehiscence of the skin, with minimal depth of about 1 mm.  No purulent drainage, erythema, or induration noted.  Covered with dry gauze dressing.  Some discomfort in the right lower quadrant at the level of the ASIS.  No evidence of hernia recurrence.  Assessment/Plan: This is a 53 y.o. male s/p robotic assisted right inguinal hernia repair, with superficial wound dehiscence.  --Discussed with the patient the findings on exam. He has a minimal skin dehiscence at the midline port site.  No evidence of infection at this point, so no antibiotics needed.  Recommended to do dry gauze dressing daily and as needed to keep the area clean/dry.  This will heal on its own. --With regards to the right lower quadrant discomfort, this may be in the location where the peritoneum gets incised to expose the hernia defect.  The discomfort may be related to inflammation/bruising that the closure of the peritoneal flap can cause, possibly ilioinguinal nerve irritation.  This should improve with time as the inflammation heals. --Patient has scheduled follow up with Mr. Dean Morton on 01/06/24.  Encouraged him to keep the appointment so we can follow up on his  healing/progress.   Dean Sheree Plant, MD [facility]

## 2024-01-01 ENCOUNTER — Encounter: Payer: Self-pay | Admitting: Internal Medicine

## 2024-01-06 ENCOUNTER — Ambulatory Visit: Payer: Self-pay | Admitting: Physician Assistant

## 2024-01-06 ENCOUNTER — Encounter: Payer: Self-pay | Admitting: Physician Assistant

## 2024-01-06 VITALS — BP 150/90 | HR 71 | Ht 73.0 in | Wt 272.0 lb

## 2024-01-06 DIAGNOSIS — T8131XA Disruption of external operation (surgical) wound, not elsewhere classified, initial encounter: Secondary | ICD-10-CM

## 2024-01-06 DIAGNOSIS — K4090 Unilateral inguinal hernia, without obstruction or gangrene, not specified as recurrent: Secondary | ICD-10-CM

## 2024-01-06 DIAGNOSIS — T8131XD Disruption of external operation (surgical) wound, not elsewhere classified, subsequent encounter: Secondary | ICD-10-CM

## 2024-01-06 DIAGNOSIS — Z09 Encounter for follow-up examination after completed treatment for conditions other than malignant neoplasm: Secondary | ICD-10-CM

## 2024-01-06 DIAGNOSIS — K4091 Unilateral inguinal hernia, without obstruction or gangrene, recurrent: Secondary | ICD-10-CM

## 2024-01-06 NOTE — Progress Notes (Signed)
 [facility] POST-OP OFFICE VISIT  01/06/2024  HPI: Dean Morton is a 53 y.o. male s/p robotic assisted right inguinal hernia repair with Dr. Jordis on 12/17/23.   He did have a visit on 06/23 with Dr Lorain secondary to drainage at umbilical port site and was found to have superficial dehiscence of the skin at that time.   He presents today for follow up. He reports he is doing much better. RLQ discomfort is improved significantly. No fever, chills, nausea, emesis. Epigastric port site still healing via secondary intention., no depth, no drainage. No other complaints   Vital signs: BP (!) 150/90   Pulse 71   Ht 6' 1 (1.854 m)   Wt 272 lb (123.4 kg)   SpO2 99%   BMI 35.89 kg/m    Physical Exam: Constitutional: Well appearing male, NAD Abdomen: Soft, non-tender, non-distended, no rebound/guarding Skin: Epigastric port site with superficial skin dehiscence, mild, no depth, no drainage, no erythema. All other laparoscopic incisions are healing well, no erythema or drainage   Assessment/Plan: This is a 53 y.o. male s/p robotic assisted right inguinal hernia repair with Dr. Jordis on 12/17/23.    - Pain control prn  - Reviewed wound care recommendation; superficial dressing as needed, okay to shower   - Reviewed lifting restrictions; 6 weeks total  - He can follow up on as needed basis; He understands to call with questions/concerns  -- Arthea Platt, PA-C [facility] 01/06/2024, 2:38 PM M-F: 7am - 4pm

## 2024-01-06 NOTE — Patient Instructions (Signed)

## 2024-02-04 ENCOUNTER — Ambulatory Visit: Payer: Self-pay | Admitting: Internal Medicine

## 2024-02-04 ENCOUNTER — Encounter: Payer: Self-pay | Admitting: Internal Medicine

## 2024-02-04 VITALS — BP 138/88 | HR 85 | Ht 73.0 in | Wt 278.2 lb

## 2024-02-04 DIAGNOSIS — Z024 Encounter for examination for driving license: Secondary | ICD-10-CM

## 2024-02-04 NOTE — Progress Notes (Signed)
 Commercial Driver Medical Examination   Dean Morton is a 53 y.o. male who presents today for a commercial driver fitness determination physical exam. The patient reports no problems. The following portions of the patient's history were reviewed and updated as appropriate: allergies, current medications, past family history, past medical history, past social history, past surgical history, and problem list.  Review of Systems  Past Medical History:  Diagnosis Date   Allergy    workplace [place] hissing roaches (10,000 +)   Anxiety    Benign prostatic hyperplasia with weak urinary stream    Essential hypertension    GERD (gastroesophageal reflux disease)    Helicobacter pylori gastritis    Hypertension    took BP meds with panic attacks   Inguinal hernia 12/2023   Obesity    Pseudofolliculitis barbae    Umbilical hernia     No current outpatient medications on file.   No current facility-administered medications for this visit.    Allergies  Allergen Reactions   Augmentin [Amoxicillin-Pot Clavulanate] Nausea Only    Family History  Problem Relation Age of Onset   Hypertension Mother    Stroke Maternal Grandmother     Social History   Socioeconomic History   Marital status: Married    Spouse name: Ellouise   Number of children: Not on file   Years of education: Not on file   Highest education level: Not on file  Occupational History   Not on file  Tobacco Use   Smoking status: Never    Passive exposure: Past   Smokeless tobacco: Never  Vaping Use   Vaping status: Never Used  Substance and Sexual Activity   Alcohol use: No   Drug use: No   Sexual activity: Yes    Birth control/protection: None  Other Topics Concern   Not on file  Social History Narrative   Not on file   Social Drivers of Health   Financial Resource Strain: Low Risk  (06/01/2023)   Overall Financial Resource Strain (CARDIA)    Difficulty of Paying Living Expenses: Not hard at all   Food Insecurity: No Food Insecurity (06/01/2023)   Hunger Vital Sign    Worried About Running Out of Food in the Last Year: Never true    Ran Out of Food in the Last Year: Never true  Transportation Needs: No Transportation Needs (06/01/2023)   PRAPARE - Administrator, Civil Service (Medical): No    Lack of Transportation (Non-Medical): No  Physical Activity: Insufficiently Active (06/01/2023)   Exercise Vital Sign    Days of Exercise per Week: 3 days    Minutes of Exercise per Session: 40 min  Stress: No Stress Concern Present (06/01/2023)   [employer] of Occupational Health - Occupational Stress Questionnaire    Feeling of Stress : Not at all  Social Connections: Socially Integrated (06/01/2023)   Social Connection and Isolation Panel    Frequency of Communication with Friends and Family: More than three times a week    Frequency of Social Gatherings with Friends and Family: Three times a week    Attends Religious Services: More than 4 times per year    Active Member of Clubs or Organizations: Yes    Attends Banker Meetings: 1 to 4 times per year    Marital Status: Married  Catering manager Violence: Not At Risk (06/01/2023)   Humiliation, Afraid, Rape, and Kick questionnaire    Fear of Current or Ex-Partner: No  Emotionally Abused: No    Physically Abused: No    Sexually Abused: No     Constitutional: Denies fever, malaise, fatigue, headache or abrupt weight changes.  HEENT: Denies eye pain, eye redness, ear pain, ringing in the ears, wax buildup, runny nose, nasal congestion, bloody nose, or sore throat. Respiratory: Denies difficulty breathing, shortness of breath, cough or sputum production.   Cardiovascular: Denies chest pain, chest tightness, palpitations or swelling in the hands or feet.  Gastrointestinal: Denies abdominal pain, bloating, constipation, diarrhea or blood in the stool.  GU: Denies urgency, frequency, pain with  urination, burning sensation, blood in urine, odor or discharge. Musculoskeletal: Denies decrease in range of motion, difficulty with gait, muscle pain or joint pain and swelling.  Skin: Denies redness, rashes, lesions or ulcercations.  Neurological: Denies dizziness, difficulty with memory, difficulty with speech or problems with balance and coordination.  Psych: Denies anxiety, depression, SI/HI.  No other specific complaints in a complete review of systems (except as listed in HPI above).   Objective:    Vision:  Uncorrected Corrected Horizontal Field of Vision  Right Eye  20/20 70 degrees  Left Eye   20/20 70 degrees  Both Eyes   20/20    Applicant can recognize and distinguish among traffic control signals and devices showing standard red, green, and amber colors.     Monocular Vision?: No   Hearing:        Right Ear  > 5 ft     Left Ear  > 73ft     BP 138/88 (BP Location: Left Arm, Patient Position: Sitting, Cuff Size: Large)   Pulse 85   Ht 6' 1 (1.854 m)   Wt 278 lb 4 oz (126.2 kg)   SpO2 98%   BMI 36.71 kg/m    Wt Readings from Last 3 Encounters:  01/06/24 272 lb (123.4 kg)  12/28/23 273 lb (123.8 kg)  12/17/23 269 lb (122 kg)    General: Appears his stated age, obese, in NAD. Skin: Warm, dry and intact. Healed lap sites noted of abdomen. HEENT: Head: normal shape and size; Eyes: sclera white, no icterus, conjunctiva pink, PERRLA and EOMs intact; Ears: Tm's gray and intact, normal light reflex; Nose: mucosa pink and moist, septum midline; Throat/Mouth: Teeth present, mucosa pink and moist, no exudate, lesions or ulcerations noted.  Neck:  Neck supple, trachea midline. No masses, lumps or thyromegaly present.  Cardiovascular: Normal rate and rhythm. S1,S2 noted.  No murmur, rubs or gallops noted. No JVD or BLE edema. No carotid bruits noted. Pulmonary/Chest: Normal effort and positive vesicular breath sounds. No respiratory distress. No wheezes, rales or  ronchi noted.  Abdomen: Soft and nontender. Normal bowel sounds. No distention or masses noted. Liver, spleen and kidneys non palpable. Musculoskeletal: Normal range of motion. No signs of joint swelling. Strength 5/5 BUE/BLE. No difficulty with gait.  Neurological: Alert and oriented. Cranial nerves II-XII grossly intact. Coordination normal.  Psychiatric: Mood and affect normal. Behavior is normal. Judgment and thought content normal.   BMET    Component Value Date/Time   NA 139 12/16/2023 0907   NA 142 07/17/2012 0135   K 3.7 12/16/2023 0907   K 3.6 07/17/2012 0135   CL 106 12/16/2023 0907   CL 108 (H) 07/17/2012 0135   CO2 27 12/16/2023 0907   CO2 27 07/17/2012 0135   GLUCOSE 82 12/16/2023 0907   GLUCOSE 85 07/17/2012 0135   BUN 18 12/16/2023 0907   BUN 11 07/17/2012 0135  CREATININE 1.15 12/16/2023 0907   CREATININE 1.23 05/29/2023 0816   CALCIUM 9.1 12/16/2023 0907   CALCIUM 8.6 07/17/2012 0135   GFRNONAA >60 12/16/2023 0907   GFRNONAA >60 07/17/2012 0135   GFRAA >60 09/10/2017 1319   GFRAA >60 07/17/2012 0135    Lipid Panel     Component Value Date/Time   CHOL 191 05/29/2023 0816   TRIG 71 05/29/2023 0816   HDL 50 05/29/2023 0816   CHOLHDL 3.8 05/29/2023 0816   LDLCALC 124 (H) 05/29/2023 0816    CBC    Component Value Date/Time   WBC 3.9 (L) 12/16/2023 0907   RBC 4.99 12/16/2023 0907   HGB 14.7 12/16/2023 0907   HGB 14.3 07/17/2012 0135   HCT 42.6 12/16/2023 0907   HCT 41.8 07/17/2012 0135   PLT 208 12/16/2023 0907   PLT 182 07/17/2012 0135   MCV 85.4 12/16/2023 0907   MCV 87 07/17/2012 0135   MCH 29.5 12/16/2023 0907   MCHC 34.5 12/16/2023 0907   RDW 12.4 12/16/2023 0907   RDW 13.2 07/17/2012 0135   LYMPHSABS 1.8 07/21/2022 1935   MONOABS 0.3 07/21/2022 1935   EOSABS 70 05/29/2023 0816   BASOSABS 32 05/29/2023 0816    Hgb A1C Lab Results  Component Value Date   HGBA1C 5.3 05/29/2023        Labs:  Urinalysis:  Specific gravity:  1.020  Protein: Negative  Blood: Negative  Glucose: Negative  Assessment:    Healthy male exam.  Meets standards in 32 CFR 391.41;  qualifies for 2 year certificate.    Plan:    Medical examiners certificate completed and printed. Return as needed.   Angeline Laura, NP

## 2024-03-08 ENCOUNTER — Ambulatory Visit: Payer: Self-pay

## 2024-03-08 NOTE — Telephone Encounter (Signed)
   Summary: rx req / sinus concern   The patient would like to be prescribed azithromycin  (ZITHROMAX  Z-PAK) 250 MG tablet [[id number]] to help with their current sinus discomfort. The patient shares that they were recently mowing grass with their face uncovered and have began to experience, their previously treated, seasonal sinus discomfort.  Please contact the patient further when possible

## 2024-03-08 NOTE — Telephone Encounter (Signed)
 FYI Only or Action Required?: Action required by provider: want prescription for zpac for sinuses .  Patient was last seen in primary care on 02/04/2024 by Antonette Angeline ORN, NP.  Called Nurse Triage reporting Sinusitis.  Symptoms began several days ago.  Interventions attempted: OTC medications: zyrtec.  Symptoms are: unchanged.  Triage Disposition: Home Care  Patient/caregiver understands and will follow disposition?: Yes  Summary: rx req / sinus concern   The patient would like to be prescribed azithromycin  (ZITHROMAX  Z-PAK) 250 MG tablet [[id number]] to help with their current sinus discomfort. The patient shares that they were recently mowing grass with their face uncovered and have began to experience, their previously treated, seasonal sinus discomfort.  Please contact the patient further when possible     Reason for Disposition  [1] Sinus congestion as part of a cold AND [2] present < 10 days  Answer Assessment - Initial Assessment Questions 1. LOCATION: Where does it hurt?      Denies pain, states change of season 2. ONSET: When did the sinus pain start?  (e.g., hours, days)      Saturday 3. SEVERITY: How bad is the pain?   (Scale 0-10; or none, mild, moderate or severe)     Denies pain 4. RECURRENT SYMPTOM: Have you ever had sinus problems before? If Yes, ask: When was the last time? and What happened that time?      Yes, seasonal 5. NASAL CONGESTION: Is the nose blocked? If Yes, ask: Can you open it or must you breathe through your mouth?     denies 6. NASAL DISCHARGE: Do you have discharge from your nose? If so ask, What color?     denies 7. FEVER: Do you have a fever? If Yes, ask: What is it, how was it measured, and when did it start?      denies 8. OTHER SYMPTOMS: Do you have any other symptoms? (e.g., sore throat, cough, earache, difficulty breathing)     Itchy throat 9. PREGNANCY: Is there any chance you are pregnant? When was your  last menstrual period?     na  Protocols used: Sinus Pain or Congestion-A-AH

## 2024-03-08 NOTE — Telephone Encounter (Signed)
 Spoke to patient, attempted to schedule a virtual visit. Patient refused he will try OTC medication and call back if no better

## 2024-03-10 ENCOUNTER — Encounter: Payer: Self-pay | Admitting: Internal Medicine

## 2024-03-10 ENCOUNTER — Ambulatory Visit (INDEPENDENT_AMBULATORY_CARE_PROVIDER_SITE_OTHER): Payer: Self-pay | Admitting: Internal Medicine

## 2024-03-10 VITALS — BP 132/88 | HR 76 | Ht 73.0 in | Wt 280.6 lb

## 2024-03-10 DIAGNOSIS — J329 Chronic sinusitis, unspecified: Secondary | ICD-10-CM

## 2024-03-10 DIAGNOSIS — B9789 Other viral agents as the cause of diseases classified elsewhere: Secondary | ICD-10-CM

## 2024-03-10 MED ORDER — AZITHROMYCIN 250 MG PO TABS: ORAL_TABLET | ORAL | 0 refills | Status: AC

## 2024-03-10 MED ORDER — PREDNISONE 10 MG PO TABS: ORAL_TABLET | ORAL | 0 refills | Status: AC

## 2024-03-10 NOTE — Patient Instructions (Signed)

## 2024-03-10 NOTE — Progress Notes (Signed)
 Subjective:    Patient ID: Dean Morton, male    DOB: [birth year], 53 y.o.   MRN: [id number]  HPI  Discussed the use of AI scribe software for clinical note transcription with the patient, who gave verbal consent to proceed.  Dean Morton is a 53 year old male with a history of seasonal allergies who presents with headaches and allergy symptoms.  He has been experiencing headaches that began on Saturday after completing some bone work. The headaches started as mild.  He also has watery eyes, which began last night and continued into this morning. He reports nasal congestion and a runny nose, with nasal discharge that was initially light green in color early this morning. He had a scratchy throat but this has improved. No ear pain, sore throat, cough, shortness of breath, fever, chills, or body aches.  He has a history of seasonal allergies, typically occurring in the spring and fall, and has been taking Allegra, with the second dose taken last night. He initially started with Zyrtec and has also taken Mucinex Fast Track. In previous years, he has taken a Z-Pak and felt back to normal within a couple of days.  He notes that someone around him had similar allergy symptoms a couple of weeks ago. He denies any known allergies to grass but acknowledges that his symptoms align with his usual seasonal allergy pattern.       Review of Systems   Past Medical History:  Diagnosis Date   Allergy    workplace [place] hissing roaches (10,000 +)   Anxiety    Benign prostatic hyperplasia with weak urinary stream    Essential hypertension    GERD (gastroesophageal reflux disease)    Helicobacter pylori gastritis    Hypertension    took BP meds with panic attacks   Inguinal hernia 12/2023   Obesity    Pseudofolliculitis barbae    Umbilical hernia     No current outpatient medications on file.   No current facility-administered medications for this visit.    Allergies  Allergen  Reactions   Augmentin [Amoxicillin-Pot Clavulanate] Nausea Only    Family History  Problem Relation Age of Onset   Hypertension Mother    Stroke Maternal Grandmother     Social History   Socioeconomic History   Marital status: Married    Spouse name: Ellouise   Number of children: Not on file   Years of education: Not on file   Highest education level: Not on file  Occupational History   Not on file  Tobacco Use   Smoking status: Never    Passive exposure: Past   Smokeless tobacco: Never  Vaping Use   Vaping status: Never Used  Substance and Sexual Activity   Alcohol use: No   Drug use: No   Sexual activity: Yes    Birth control/protection: None  Other Topics Concern   Not on file  Social History Narrative   Not on file   Social Drivers of Health   Financial Resource Strain: Low Risk  (06/01/2023)   Overall Financial Resource Strain (CARDIA)    Difficulty of Paying Living Expenses: Not hard at all  Food Insecurity: No Food Insecurity (06/01/2023)   Hunger Vital Sign    Worried About Running Out of Food in the Last Year: Never true    Ran Out of Food in the Last Year: Never true  Transportation Needs: No Transportation Needs (06/01/2023)   PRAPARE - Transportation    Lack  of Transportation (Medical): No    Lack of Transportation (Non-Medical): No  Physical Activity: Insufficiently Active (06/01/2023)   Exercise Vital Sign    Days of Exercise per Week: 3 days    Minutes of Exercise per Session: 40 min  Stress: No Stress Concern Present (06/01/2023)   [employer] of Occupational Health - Occupational Stress Questionnaire    Feeling of Stress : Not at all  Social Connections: Socially Integrated (06/01/2023)   Social Connection and Isolation Panel    Frequency of Communication with Friends and Family: More than three times a week    Frequency of Social Gatherings with Friends and Family: Three times a week    Attends Religious Services: More than 4 times per  year    Active Member of Clubs or Organizations: Yes    Attends Banker Meetings: 1 to 4 times per year    Marital Status: Married  Catering manager Violence: Not At Risk (06/01/2023)   Humiliation, Afraid, Rape, and Kick questionnaire    Fear of Current or Ex-Partner: No    Emotionally Abused: No    Physically Abused: No    Sexually Abused: No     Constitutional: Pt reports headache. Denies fever, malaise, fatigue, or abrupt weight changes.  HEENT: Pt reports watery eyes, runny nose, and nasal congestion. Denies eye pain, eye redness, ear pain, ringing in the ears, wax buildup, runny nose, nasal congestion, bloody nose, or sore throat. Respiratory: Denies difficulty breathing, shortness of breath, cough or sputum production.   Cardiovascular: Denies chest pain, chest tightness, palpitations or swelling in the hands or feet.  Gastrointestinal: Denies abdominal pain, bloating, constipation, diarrhea or blood in the stool.  GU: Denies urgency, frequency, pain with urination, burning sensation, blood in urine, odor or discharge. Musculoskeletal: Denies decrease in range of motion, difficulty with gait, muscle pain or joint pain and swelling.  Skin: Denies redness, rashes, lesions or ulcercations.  Neurological: Denies dizziness, difficulty with memory, difficulty with speech or problems with balance and coordination.  Psych: Denies anxiety, depression, SI/HI.  No other specific complaints in a complete review of systems (except as listed in HPI above).      Objective:   Physical Exam  BP 132/88 (BP Location: Left Arm, Patient Position: Sitting, Cuff Size: Large)   Pulse 76   Ht 6' 1 (1.854 m)   Wt 280 lb 9.6 oz (127.3 kg)   SpO2 97%   BMI 37.02 kg/m   Wt Readings from Last 3 Encounters:  02/04/24 278 lb 4 oz (126.2 kg)  01/06/24 272 lb (123.4 kg)  12/28/23 273 lb (123.8 kg)    General: Appears his stated age, obese, in NAD. HEENT: Head: normal shape and size,  no sinus tenderness noted; Eyes: sclera white, no icterus, conjunctiva pink, PERRLA and EOMs intact; Ears: Tm's gray and intact, normal light reflex; Nose: mucosa boggy and moist, septum midline, turbinates swollen; Throat/Mouth: Teeth present, mucosa pink and moist, + PND, no exudate, lesions or ulcerations noted.  Neck: No adenopathy noted. Cardiovascular: Normal rate and rhythm. S1,S2 noted.   Pulmonary/Chest: Normal effort and positive vesicular breath sounds. No respiratory distress. No wheezes, rales or ronchi noted.  Neurological: Alert and oriented.   BMET    Component Value Date/Time   NA 139 12/16/2023 0907   NA 142 07/17/2012 0135   K 3.7 12/16/2023 0907   K 3.6 07/17/2012 0135   CL 106 12/16/2023 0907   CL 108 (H) 07/17/2012 0135   CO2  27 12/16/2023 0907   CO2 27 07/17/2012 0135   GLUCOSE 82 12/16/2023 0907   GLUCOSE 85 07/17/2012 0135   BUN 18 12/16/2023 0907   BUN 11 07/17/2012 0135   CREATININE 1.15 12/16/2023 0907   CREATININE 1.23 05/29/2023 0816   CALCIUM 9.1 12/16/2023 0907   CALCIUM 8.6 07/17/2012 0135   GFRNONAA >60 12/16/2023 0907   GFRNONAA >60 07/17/2012 0135   GFRAA >60 09/10/2017 1319   GFRAA >60 07/17/2012 0135    Lipid Panel     Component Value Date/Time   CHOL 191 05/29/2023 0816   TRIG 71 05/29/2023 0816   HDL 50 05/29/2023 0816   CHOLHDL 3.8 05/29/2023 0816   LDLCALC 124 (H) 05/29/2023 0816    CBC    Component Value Date/Time   WBC 3.9 (L) 12/16/2023 0907   RBC 4.99 12/16/2023 0907   HGB 14.7 12/16/2023 0907   HGB 14.3 07/17/2012 0135   HCT 42.6 12/16/2023 0907   HCT 41.8 07/17/2012 0135   PLT 208 12/16/2023 0907   PLT 182 07/17/2012 0135   MCV 85.4 12/16/2023 0907   MCV 87 07/17/2012 0135   MCH 29.5 12/16/2023 0907   MCHC 34.5 12/16/2023 0907   RDW 12.4 12/16/2023 0907   RDW 13.2 07/17/2012 0135   LYMPHSABS 1.8 07/21/2022 1935   MONOABS 0.3 07/21/2022 1935   EOSABS 70 05/29/2023 0816   BASOSABS 32 05/29/2023 0816    Hgb  A1C Lab Results  Component Value Date   HGBA1C 5.3 05/29/2023            Assessment & Plan:   Assessment and Plan    Viral sinusitis Allergic rhinitis with headache, nasal congestion, rhinorrhea post allergen exposure. Nasal discharge suggests inflammation without bacterial infection. Symptoms support allergic rhinitis.  We had a long discussion about the difference between a bacterial infection and allergies.  Also discussed antibiotic overuse and how it leads to antibiotic resistance - Prescribed prednisone  taper for six days.  He admits that he is hesitant to take this - Prescribed Z-Pak (azithromycin  250 mg) as backup to start on Sunday if symptoms worsen. - Advised continuation of Allegra. - Recommended starting Flonase  with seasonal changes. - Educated on initiating antihistamines and nasal spray prior to seasonal changes.      Followup with your PCP as previously scheduled Angeline Laura, NP
